# Patient Record
Sex: Female | Born: 1975 | Race: Black or African American | Hispanic: No | Marital: Married | Smoking: Never smoker
Health system: Southern US, Community
[De-identification: ages and names within clinical notes are randomized; demographics above are authoritative.]

## PROBLEM LIST (undated history)

## (undated) ENCOUNTER — Emergency Department (HOSPITAL_COMMUNITY): Discharge status: Against Medical Advice | Payer: Self-pay

## (undated) DIAGNOSIS — T7840XA Allergy, unspecified, initial encounter: Secondary | ICD-10-CM

## (undated) DIAGNOSIS — E041 Nontoxic single thyroid nodule: Secondary | ICD-10-CM

## (undated) DIAGNOSIS — K219 Gastro-esophageal reflux disease without esophagitis: Secondary | ICD-10-CM

## (undated) DIAGNOSIS — I719 Aortic aneurysm of unspecified site, without rupture: Secondary | ICD-10-CM

## (undated) DIAGNOSIS — R87619 Unspecified abnormal cytological findings in specimens from cervix uteri: Secondary | ICD-10-CM

## (undated) DIAGNOSIS — IMO0002 Reserved for concepts with insufficient information to code with codable children: Secondary | ICD-10-CM

## (undated) HISTORY — DX: Aortic aneurysm of unspecified site, without rupture: I71.9

## (undated) HISTORY — DX: Unspecified abnormal cytological findings in specimens from cervix uteri: R87.619

## (undated) HISTORY — DX: Allergy, unspecified, initial encounter: T78.40XA

## (undated) HISTORY — DX: Nontoxic single thyroid nodule: E04.1

## (undated) HISTORY — PX: BREAST SURGERY: SHX581

## (undated) HISTORY — PX: WISDOM TOOTH EXTRACTION: SHX21

## (undated) HISTORY — DX: Gastro-esophageal reflux disease without esophagitis: K21.9

## (undated) HISTORY — PX: INTRAUTERINE DEVICE INSERTION: SHX323

## (undated) HISTORY — PX: COLPOSCOPY: SHX161

## (undated) HISTORY — DX: Reserved for concepts with insufficient information to code with codable children: IMO0002

---

## 2004-09-28 HISTORY — PX: REDUCTION MAMMAPLASTY: SUR839

## 2004-09-28 HISTORY — PX: BREAST REDUCTION SURGERY: SHX8

## 2005-10-23 ENCOUNTER — Ambulatory Visit (HOSPITAL_COMMUNITY): Admission: RE | Admit: 2005-10-23 | Discharge: 2005-10-23 | Payer: Self-pay | Admitting: Cardiovascular Disease

## 2009-03-06 ENCOUNTER — Encounter: Payer: Self-pay | Admitting: Gastroenterology

## 2009-05-16 ENCOUNTER — Ambulatory Visit: Payer: Self-pay | Admitting: Gastroenterology

## 2009-05-16 DIAGNOSIS — K219 Gastro-esophageal reflux disease without esophagitis: Secondary | ICD-10-CM

## 2009-06-11 ENCOUNTER — Ambulatory Visit: Payer: Self-pay | Admitting: Gastroenterology

## 2009-06-12 ENCOUNTER — Telehealth: Payer: Self-pay | Admitting: Gastroenterology

## 2009-06-19 ENCOUNTER — Ambulatory Visit (HOSPITAL_COMMUNITY): Admission: RE | Admit: 2009-06-19 | Discharge: 2009-06-19 | Payer: Self-pay | Admitting: Gastroenterology

## 2009-06-20 ENCOUNTER — Encounter: Payer: Self-pay | Admitting: Gastroenterology

## 2009-06-25 ENCOUNTER — Ambulatory Visit: Payer: Self-pay | Admitting: Gastroenterology

## 2010-10-19 ENCOUNTER — Encounter: Payer: Self-pay | Admitting: Internal Medicine

## 2011-03-10 ENCOUNTER — Emergency Department (HOSPITAL_COMMUNITY)
Admission: EM | Admit: 2011-03-10 | Discharge: 2011-03-10 | Disposition: A | Discharge status: Home / Self Care | Payer: Self-pay | Attending: Emergency Medicine | Admitting: Emergency Medicine

## 2011-03-10 DIAGNOSIS — K089 Disorder of teeth and supporting structures, unspecified: Secondary | ICD-10-CM | POA: Insufficient documentation

## 2011-03-10 DIAGNOSIS — H9209 Otalgia, unspecified ear: Secondary | ICD-10-CM | POA: Insufficient documentation

## 2012-09-12 HISTORY — PX: INTRAUTERINE DEVICE INSERTION: SHX323

## 2012-10-13 ENCOUNTER — Other Ambulatory Visit (HOSPITAL_COMMUNITY)
Admission: RE | Admit: 2012-10-13 | Discharge: 2012-10-13 | Disposition: A | Discharge status: Home / Self Care | Payer: 59 | Source: Ambulatory Visit | Attending: Obstetrics & Gynecology | Admitting: Obstetrics & Gynecology

## 2012-10-13 ENCOUNTER — Encounter: Payer: Self-pay | Admitting: Obstetrics & Gynecology

## 2012-10-13 ENCOUNTER — Ambulatory Visit (INDEPENDENT_AMBULATORY_CARE_PROVIDER_SITE_OTHER): Payer: 59 | Admitting: Obstetrics & Gynecology

## 2012-10-13 VITALS — BP 129/89 | HR 64 | Temp 97.3°F | Ht 67.0 in | Wt 185.2 lb

## 2012-10-13 DIAGNOSIS — R87613 High grade squamous intraepithelial lesion on cytologic smear of cervix (HGSIL): Secondary | ICD-10-CM

## 2012-10-13 DIAGNOSIS — R87619 Unspecified abnormal cytological findings in specimens from cervix uteri: Secondary | ICD-10-CM | POA: Insufficient documentation

## 2012-10-13 NOTE — Progress Notes (Signed)
Patient ID: Christina Hardy, female   DOB: 1976-07-12, 36 y.o.   MRN: [id number]  Chief Complaint  Patient presents with  . Colposcopy    HPI Christina Hardy is a 37 y.o. female.   HPI  Indications: Pap smear on December 2013 showed: high-grade squamous intraepithelial neoplasia  (HGSIL-encompassing moderate and severe dysplasia); positive for high risk HPV. Previous colposcopy: no prior colposcopy. Prior cervical treatment: no treatment.  Past Medical History  Diagnosis Date  . Abnormal Pap smear     Past Surgical History  Procedure Date  . Breast reduction surgery 2006    Family History  Problem Relation Age of Onset  . Heart failure Father   . Cancer Mother     breast   . Hypertension Maternal Grandmother     Social History History  Substance Use Topics  . Smoking status: Never Smoker   . Smokeless tobacco: Never Used  . Alcohol Use: Yes     Comment: socially    Allergies  Allergen Reactions  . Latex Hives  . Prilosec (Omeprazole) Hives    Current Outpatient Prescriptions  Medication Sig Dispense Refill  . fluticasone (FLONASE) 50 MCG/ACT nasal spray Place 2 sprays into the nose as needed.      . vitamin E 1000 UNIT capsule Take 1,000 Units by mouth daily.        Review of Systems Review of Systems See HPI.  Blood pressure 129/89, pulse 64, temperature 97.3 F (36.3 C), temperature source Oral, height 5\' 7"  (1.702 m), weight 185 lb 3.2 oz (84.006 kg), last menstrual period 09/09/2012.  Physical Exam Physical Exam  Constitutional: She appears well-developed and well-nourished. No distress.  Genitourinary: Vagina normal and uterus normal. No vaginal discharge found.  Skin: She is not diaphoretic.   Data Reviewed Pap smear form 08/2012  Assessment    Procedure Details  Patient given informed consent, signed copy in the chart, time out was performed.  Placed in lithotomy position. Cervix viewed with speculum and colposcope after application of acetic  acid.   Colposcopy adequate?  yes Acetowhite lesions?yes at 3 and 9 o'clock Punctation?no Mosaicism?  no Abnormal vasculature?  no Biopsies?yes at 3 and 9 o'clock ECC?no  Patient was given post procedure instructions.  She will return in 2 weeks for results.  Carolyn L. Harraway-Smith, M.D., FACOG   Complications: none.     Plan    Extensive teaching on abnormal pap and HPV provided. Specimens labelled and sent to Pathology. Return to discuss Pathology results in 4 weeks. Reviewed LEEP video prior to leaving       BOOTH, ERIN 10/13/2012, 4:08 PM

## 2012-10-13 NOTE — Patient Instructions (Addendum)
Cervical Dysplasia Cervical dysplasia is a condition in which a woman has abnormal changes in the cells of her cervix. The cervix is the opening to the uterus (womb) between the vagina and the uterus. These changes are called cervical dysplasia and may be the first signs of cervical cancer. These cells can be taken from the cervix during a Pap test and then looked at under a microscope. With early detection, treatment, and close follow-up care, nearly all cervical dysplasia can be cured. If untreated, the mild to moderate stages of dysplasia often grow more severe.  RISK FACTORS  The following increase the risk for cervical dysplasia.  Having had a sexually transmitted disease, including:  Chlamydia.  Human papilloma virus (HPV).  Becoming sexually active before age 18.  Having had more than 1 sexual partner.  Not using protection, such as condoms, during sexual intercourse, especially with new sexual partners.  Having had cancer of the vagina or vulva.  Having a sexual partner whose previous partner had cancer of the cervix or cervical dysplasia.  Having a sexual partner who has or has had cancer of the penis.  Having a weakened immune system (HIV, organ transplant).  Being the daughter of a woman who took DES (diethylstilbestrol) during pregnancy.  A history of cervical cancer in a woman's sister or mother.  Smoking.  Having had an abnormal Pap test in the past. SYMPTOMS  There are usually no symptoms. If there are symptoms, they may be vague such as:  Abnormal vaginal discharge.  Bleeding between periods or following intercourse.  Bleeding during menopause.  Pain on intercourse (dyspareunia). DIAGNOSIS   The Pap test is the best way of detecting abnormalities of the cervix.  Biopsy (removing a piece of tissue to look at under the microscope) of the cervix when the Pap test is abnormal or when the Pap test is normal, but the cervix looks abnormal. TREATMENT   Catching and treating the changes early with Pap tests can prevent cervical cancer.  Cryotherapy freezes the abnormal cells with a steel tip instrument.  A laser can be used to remove the abnormal cells.  Loop electrocautery excision procedure (LEEP). This procedure uses a heated electrical loop to remove a cone-like portion of the cervix, including the cervical canal.  For more serious cases of cervical dysplasia, the abnormal tissue may be removed surgically by:  A cone biopsy (by cold knife, laser or LEEP). A procedure in which a portion of the center of the cervix with the cervical canal is removed.  The uterus and cervix are removed (hysterectomy). Your caregiver will advise you regarding the need and timing of Pap tests in your follow-up. Women who have been treated for dysplasia should be closely followed with pelvic exams and Pap tests. During the first year following treatment of cervical dysplasia, Pap tests should be done every 3 to 4 months. In the second year, the schedule is every 6 months, or as recommended by your caregiver. See your caregiver for new or worsening problems. HOME CARE INSTRUCTIONS   Follow the instructions and recommendations of your caregiver regarding medicines and follow-up appointments.  Only take over-the-counter or prescription medicines for pain or discomfort as directed by your caregiver.  Cramping and pelvic discomfort may follow cryotherapy. It is not abnormal to have watery discharge for several weeks after.  Laser, cone surgery, cryotherapy or LEEP can cause a bad smelling vaginal discharge. It may also cause vaginal bleeding for a couple weeks following the procedure. The discharge   may be black from the paste used to control bleeding from the cone site. This is normal.  Do not use tampons, have sexual intercourse or douche until your caregiver says it is okay. SEEK MEDICAL CARE IF:   You develop genital warts.  You need a prescription for  pain medicine following your treatment. SEEK IMMEDIATE MEDICAL CARE IF:   Your bleeding is heavier than a normal menstrual period.  You develop bright red bleeding, especially if you have blood clots.  You have a fever.  You have increasing cramps or pain not relieved with medicine.  You are lightheaded, unusually weak, or have fainting spells.  You have abnormal vaginal discharge.  You develop abdominal pain. PREVENTION   The surest way to prevent cervical dysplasia is to abstain from sexual intercourse.  Practice safe sex, use condoms and have only one sex partner who does not have other sex partners.  A Pap test is done to screen for cervical cancer.  The first Pap test should be done at age 21.  Between ages 21 and 29, Pap tests are repeated every 2 years.  Beginning at age 30, you are advised to have a Pap test every 3 years as long as your past 3 Pap tests have been normal.  Some women have medical problems that increase the chance of getting cervical cancer. Talk to your caregiver about these problems. It is especially important to talk to your caregiver if a new problem develops soon after your last Pap test. In these cases, your caregiver may recommend more frequent screening and Pap tests.  The above recommendations are the same for women who have or have not gotten the vaccine for HPV (Human Papillomavirus).  If you had a hysterectomy for a problem that was not a cancer or a condition that could lead to cancer, then you no longer need Pap tests. However, even if you no longer need a Pap test, a regular exam is a good idea to make sure no other problems are starting.   If you are between ages 65 and 70, and you have had normal Pap tests going back 10 years, you no longer need Pap tests. However, even if you no longer need a Pap test, a regular exam is a good idea to make sure no other problems are starting.   If you have had past treatment for cervical cancer or a  condition that could lead to cancer, you need Pap tests and screening for cancer for at least 20 years after your treatment.  If Pap tests have been discontinued, risk factors (such as a new sexual partner) need to be re-assessed to determine if screening should be resumed.  Some women may need screenings more often if they are at high risk for cervical cancer.  Your caregiver may do additional tests including:  Colposcopy. A procedure in which a special microscope magnifies the cells and allows the provider to closely examine the cervix, vagina, and vulva.  Biopsy. A small tissue sample is taken from the cervix, vagina or vulva. This is generally done in your caregivers office.  A cone biopsy (cold knife or laser). A large tissue sample is taken from the cervix. This procedure is usually done in an operating room under a general anesthetic. The cone often removes all abnormal tissue and so may also complete the treatment.  LEEP, also removing a circular portion of the cervix and is done in a doctors office under a local anesthetic.  Now there   is a vaccine, Gardasil, that was developed to prevent the HPV'S that can cause cancer of the cervix and genital warts. It is recommended for females ages 9 to 26. It should not be given to pregnant women until more is known about its effects on the fetus. Not all cancers of the cervix are caused by the HPV. Routine gynecology exams and Pap tests should continue as recommended by your caregiver. Document Released: 09/14/2005 Document Revised: 12/07/2011 Document Reviewed: 09/05/2008 Exit[employer] Patient Information 2013 [employer]. Loop Electrosurgical Excision Procedure Loop electrosurgical excision procedure (LEEP) is the removal of a portion of the lower part of the uterus (cervix). The procedure is done when there are significantly abnormal cervical cell changes. Abnormal cell changes of the cervix can lead to cancer if left in place and untreated.   The LEEP procedure itself typically only takes a few minutes. Often, it may be done in your caregiver's office. The procedure is considered safe for those who wish to get pregnant or are trying to get pregnant. Only under rare circumstances should this procedure be done if you are pregnant. LET YOUR CAREGIVER KNOW ABOUT:  Whether you are pregnant or late for your last menstrual period.  Allergies to foods or medicines.  All the medicines you are taking includingherbs, eyedrops, and over-the-counter medicines, and creams.  Use of steroids (by mouth or creams).  Previous problems with anesthetics or numbing medicine.  Previous gynecological surgery.  History of blood clots or bleeding problems.  Any recent or current vaginal infections (herpes, sexually transmitted infections).  Other health problems. RISKS AND COMPLICATIONS  Bleeding.  Infection.  Injury to the vagina, bladder, or rectum.  Very rare obstruction of the cervical opening that causes problems during menstruation (cervical stenosis). BEFORE THE PROCEDURE  Do not take aspirin or blood thinners (anticoagulants) for 1 week before the procedure, or as told by your caregiver.  Eat a light meal before the procedure.  Ask your caregiver about changing or stopping your regular medicines.  You may be given a pain reliever 1 or 2 hours before the procedure. PROCEDURE   A tool (speculum) is placed in the vagina. This allows your caregiver to see the cervix.  An iodine stain is applied to the cervix to find the area of abnormal cells to be removed.  Medicine is injected to numb the cervix (local anesthetic).   Electricity is passed through a thin wire loop which is then used to remove (cauterize) a small segment of the affected cervix.  Light electrocautery is used to seal any small blood vessels and prevent bleeding.  A paste may be applied to the cauterized area of the cervix to help prevent bleeding.  The  tissue sample is sent to the lab. It is examined under the microscope. AFTER THE PROCEDURE  Have someone drive you home.  You may have slight to moderate cramping.  You may notice a black vaginal discharge from the paste used on the cervix to prevent bleeding. This is normal.  Watch for excessive bleeding. This requires immediate medical care.  Ask when your test results will be ready. Make sure you get your test results. Document Released: 12/05/2002 Document Revised: 12/07/2011 Document Reviewed: 02/24/2011 Exit[employer] Patient Information 2013 [employer].  

## 2012-10-17 ENCOUNTER — Telehealth: Payer: Self-pay | Admitting: General Practice

## 2012-10-17 NOTE — Telephone Encounter (Signed)
Called patient, no answer, left message to call us back for additional information

## 2012-10-17 NOTE — Telephone Encounter (Signed)
Message copied by Kathee Delton on Mon Oct 17, 2012  3:39 PM ------      Message from: Christina Hardy      Created: Mon Oct 17, 2012  1:41 PM       Please call pt.  Needs f/u PAP and HPV in 1 year.  For low grade dysplasia            Thx,      clh-S

## 2012-10-18 NOTE — Telephone Encounter (Signed)
Called pt and informed her of Pap result and recommendation for follow up per Dr. Erin Fulling. Pt agreed and voiced understanding.

## 2012-11-10 ENCOUNTER — Ambulatory Visit: Payer: 59 | Admitting: Obstetrics & Gynecology

## 2012-11-18 ENCOUNTER — Telehealth: Payer: Self-pay | Admitting: Obstetrics & Gynecology

## 2012-11-18 DIAGNOSIS — K219 Gastro-esophageal reflux disease without esophagitis: Secondary | ICD-10-CM

## 2012-11-18 DIAGNOSIS — IMO0002 Reserved for concepts with insufficient information to code with codable children: Secondary | ICD-10-CM | POA: Insufficient documentation

## 2012-11-18 NOTE — Telephone Encounter (Signed)
Faculty Practice OB/GYN Attending Phone Call Documentation  Received call from Dr. Faythe Casa from [facility] regarding follow up plans for a patient that was referred to [place] for colposcopy in the setting of HGSIL pap.  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  Patient was initially given the option of repeating cotesting in 12 months.  Dr. Faythe Casa felt that patient should be offered a diagnostic excisional procedure (LEEP) given concern about high grade dysplasia.  Patient was called and her primary GYN's recommendation was discussed with her.  Patient will contact her insurance company to see if this procedure will be covered; she was informed of the estimated cost of the LEEP at our clinic.  She was advised to see if the cost will be better at her primary GYN's office.  Patient will call back to make appointment for LEEP if she decides to undergo this procedure at our clinic.  Of note, patient has already watched the LEEP video and will only need to be scheduled for the procedure.  Jaynie Collins, MD, FACOG Attending Obstetrician & Gynecologist Faculty Practice, [facility] [place]

## 2012-12-01 ENCOUNTER — Ambulatory Visit (INDEPENDENT_AMBULATORY_CARE_PROVIDER_SITE_OTHER): Payer: 59 | Admitting: Obstetrics & Gynecology

## 2012-12-01 ENCOUNTER — Encounter: Payer: Self-pay | Admitting: Obstetrics & Gynecology

## 2012-12-01 VITALS — BP 129/84 | HR 55 | Temp 97.1°F | Ht 67.0 in | Wt 184.8 lb

## 2012-12-01 DIAGNOSIS — N870 Mild cervical dysplasia: Secondary | ICD-10-CM

## 2012-12-01 NOTE — Progress Notes (Signed)
Patient ID: Christina Hardy, female   DOB: 1976-05-17, 37 y.o.   MRN: [id number]  HPI Christina Hardy is a 37 y.o. female. Pt presents for review of results of colpo and PAP. The results did not agree and patient was told by another outside Christina Hardy that she needed a LEEP.  Pt with no prior pregnancies hope to have children someday.  THe patient was not aware of the potential long term complications of a LEEP.    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 Prior cervical treatment: no treatment.  Past Medical History  Diagnosis Date  . Abnormal Pap smear     Past Surgical History  Procedure Laterality Date  . Breast reduction surgery  2006    Family History  Problem Relation Age of Onset  . Heart failure Father   . Cancer Mother     breast   . Hypertension Maternal Grandmother     Social History History  Substance Use Topics  . Smoking status: Never Smoker   . Smokeless tobacco: Never Used  . Alcohol Use: Yes     Comment: socially    Allergies  Allergen Reactions  . Latex Hives  . Prilosec (Omeprazole) Hives    Current Outpatient Prescriptions  Medication Sig Dispense Refill  . fluticasone (FLONASE) 50 MCG/ACT nasal spray Place 2 sprays into the nose as needed.      . vitamin E 1000 UNIT capsule Take 1,000 Units by mouth daily.       No current facility-administered medications for this visit.    Review of Systems Review of Systems  Blood pressure 129/84, pulse 55, temperature 97.1 F (36.2 C), temperature source Oral, height 5\' 7"  (1.702 m), weight 184 lb 12.8 oz (83.825 kg), last menstrual period 11/29/2012.  Physical Exam Physical Exam  Data Reviewed Results above  Assessment    Procedure Details  The risks and benefits of the procedure and Written informed consent obtained. After discussion of LEEP potential complication pt opted for repeat colpo with bx.  lPatient given informed consent, signed copy in the chart, time out was performed.  Placed in lithotomy position. Cervix viewed with speculum and colposcope after application of acetic acid.   Colposcopy adequate?  Yes Acetowhite lesions?none Punctation?none Mosaicism?  none Abnormal vasculature? none Biopsies?2 at 5:00 and 7:00 these were areas of sl decreased uptake of Lugol's ECC?yes  This was the patients 2nd colpo after her HGSIL/ ?CIS pap.  I repeated the PAP today again as the colpo was certainly more in line with the low grade dysplasia result from the prev colpo  Patient was given post procedure instructions.  She will return in 2-4 weeks for results.   HARRAWAY-SMITH, CAROLYN 12/01/2012, 4:42 PM

## 2012-12-01 NOTE — Patient Instructions (Signed)
Cervical Dysplasia Cervical dysplasia is a condition in which a woman has abnormal changes in the cells of her cervix. The cervix is the opening to the uterus (womb) between the vagina and the uterus. These changes are called cervical dysplasia and may be the first signs of cervical cancer. These cells can be taken from the cervix during a Pap test and then looked at under a microscope. With early detection, treatment, and close follow-up care, nearly all cervical dysplasia can be cured. If untreated, the mild to moderate stages of dysplasia often grow more severe.  RISK FACTORS  The following increase the risk for cervical dysplasia.  Having had a sexually transmitted disease, including:  Chlamydia.  Human papilloma virus (HPV).  Becoming sexually active before age 18.  Having had more than 1 sexual partner.  Not using protection, such as condoms, during sexual intercourse, especially with new sexual partners.  Having had cancer of the vagina or vulva.  Having a sexual partner whose previous partner had cancer of the cervix or cervical dysplasia.  Having a sexual partner who has or has had cancer of the penis.  Having a weakened immune system (HIV, organ transplant).  Being the daughter of a woman who took DES (diethylstilbestrol) during pregnancy.  A history of cervical cancer in a woman's sister or mother.  Smoking.  Having had an abnormal Pap test in the past. SYMPTOMS  There are usually no symptoms. If there are symptoms, they may be vague such as:  Abnormal vaginal discharge.  Bleeding between periods or following intercourse.  Bleeding during menopause.  Pain on intercourse (dyspareunia). DIAGNOSIS   The Pap test is the best way of detecting abnormalities of the cervix.  Biopsy (removing a piece of tissue to look at under the microscope) of the cervix when the Pap test is abnormal or when the Pap test is normal, but the cervix looks abnormal. TREATMENT    Catching and treating the changes early with Pap tests can prevent cervical cancer.  Cryotherapy freezes the abnormal cells with a steel tip instrument.  A laser can be used to remove the abnormal cells.  Loop electrocautery excision procedure (LEEP). This procedure uses a heated electrical loop to remove a cone-like portion of the cervix, including the cervical canal.  For more serious cases of cervical dysplasia, the abnormal tissue may be removed surgically by:  A cone biopsy (by cold knife, laser or LEEP). A procedure in which a portion of the center of the cervix with the cervical canal is removed.  The uterus and cervix are removed (hysterectomy). Your caregiver will advise you regarding the need and timing of Pap tests in your follow-up. Women who have been treated for dysplasia should be closely followed with pelvic exams and Pap tests. During the first year following treatment of cervical dysplasia, Pap tests should be done every 3 to 4 months. In the second year, the schedule is every 6 months, or as recommended by your caregiver. See your caregiver for new or worsening problems. HOME CARE INSTRUCTIONS   Follow the instructions and recommendations of your caregiver regarding medicines and follow-up appointments.  Only take over-the-counter or prescription medicines for pain or discomfort as directed by your caregiver.  Cramping and pelvic discomfort may follow cryotherapy. It is not abnormal to have watery discharge for several weeks after.  Laser, cone surgery, cryotherapy or LEEP can cause a bad smelling vaginal discharge. It may also cause vaginal bleeding for a couple weeks following the procedure. The   discharge may be black from the paste used to control bleeding from the cone site. This is normal.  Do not use tampons, have sexual intercourse or douche until your caregiver says it is okay. SEEK MEDICAL CARE IF:   You develop genital warts.  You need a prescription for  pain medicine following your treatment. SEEK IMMEDIATE MEDICAL CARE IF:   Your bleeding is heavier than a normal menstrual period.  You develop bright red bleeding, especially if you have blood clots.  You have a fever.  You have increasing cramps or pain not relieved with medicine.  You are lightheaded, unusually weak, or have fainting spells.  You have abnormal vaginal discharge.  You develop abdominal pain. PREVENTION   The surest way to prevent cervical dysplasia is to abstain from sexual intercourse.  Practice safe sex, use condoms and have only one sex partner who does not have other sex partners.  A Pap test is done to screen for cervical cancer.  The first Pap test should be done at age 21.  Between ages 21 and 29, Pap tests are repeated every 2 years.  Beginning at age 30, you are advised to have a Pap test every 3 years as long as your past 3 Pap tests have been normal.  Some women have medical problems that increase the chance of getting cervical cancer. Talk to your caregiver about these problems. It is especially important to talk to your caregiver if a new problem develops soon after your last Pap test. In these cases, your caregiver may recommend more frequent screening and Pap tests.  The above recommendations are the same for women who have or have not gotten the vaccine for HPV (Human Papillomavirus).  If you had a hysterectomy for a problem that was not a cancer or a condition that could lead to cancer, then you no longer need Pap tests. However, even if you no longer need a Pap test, a regular exam is a good idea to make sure no other problems are starting.   If you are between ages 65 and 70, and you have had normal Pap tests going back 10 years, you no longer need Pap tests. However, even if you no longer need a Pap test, a regular exam is a good idea to make sure no other problems are starting.   If you have had past treatment for cervical cancer or a  condition that could lead to cancer, you need Pap tests and screening for cancer for at least 20 years after your treatment.  If Pap tests have been discontinued, risk factors (such as a new sexual partner) need to be re-assessed to determine if screening should be resumed.  Some women may need screenings more often if they are at high risk for cervical cancer.  Your caregiver may do additional tests including:  Colposcopy. A procedure in which a special microscope magnifies the cells and allows the provider to closely examine the cervix, vagina, and vulva.  Biopsy. A small tissue sample is taken from the cervix, vagina or vulva. This is generally done in your caregivers office.  A cone biopsy (cold knife or laser). A large tissue sample is taken from the cervix. This procedure is usually done in an operating room under a general anesthetic. The cone often removes all abnormal tissue and so may also complete the treatment.  LEEP, also removing a circular portion of the cervix and is done in a doctors office under a local anesthetic.  Now   there is a vaccine, Gardasil, that was developed to prevent the HPV'S that can cause cancer of the cervix and genital warts. It is recommended for females ages 9 to 26. It should not be given to pregnant women until more is known about its effects on the fetus. Not all cancers of the cervix are caused by the HPV. Routine gynecology exams and Pap tests should continue as recommended by your caregiver. Document Released: 09/14/2005 Document Revised: 12/07/2011 Document Reviewed: 09/05/2008 [employer] Patient Information 2013 [employer].  

## 2012-12-02 ENCOUNTER — Other Ambulatory Visit: Payer: Self-pay | Admitting: Obstetrics & Gynecology

## 2012-12-05 ENCOUNTER — Other Ambulatory Visit (HOSPITAL_COMMUNITY)
Admission: RE | Admit: 2012-12-05 | Discharge: 2012-12-05 | Disposition: A | Discharge status: Home / Self Care | Payer: 59 | Source: Ambulatory Visit | Attending: Obstetrics & Gynecology | Admitting: Obstetrics & Gynecology

## 2012-12-05 DIAGNOSIS — N870 Mild cervical dysplasia: Secondary | ICD-10-CM | POA: Insufficient documentation

## 2012-12-05 NOTE — Addendum Note (Signed)
Addended by: Faythe Casa on: 12/05/2012 08:32 AM   Modules accepted: Orders

## 2012-12-11 ENCOUNTER — Encounter: Payer: Self-pay | Admitting: Obstetrics & Gynecology

## 2012-12-12 ENCOUNTER — Telehealth: Payer: Self-pay | Admitting: General Practice

## 2012-12-12 NOTE — Telephone Encounter (Signed)
Called patient and informed patient of results & recommendations. Patient verbalized understanding and asked if we could cancel her results appt in April then. Told patient I would do that for her. Patient had no further questions. Patient will call back in August for appt.

## 2012-12-12 NOTE — Telephone Encounter (Signed)
Message copied by Kathee Delton on Mon Dec 12, 2012 11:44 AM ------      Message from: Willodean Rosenthal      Created: Sun Dec 11, 2012  5:19 PM       Please call pt.  Rec f/u PAP in 6months            clh-[id number]    ------

## 2012-12-30 ENCOUNTER — Ambulatory Visit: Payer: 59 | Admitting: Obstetrics & Gynecology

## 2013-08-03 ENCOUNTER — Other Ambulatory Visit: Payer: Self-pay

## 2013-09-27 ENCOUNTER — Encounter: Payer: Self-pay | Admitting: Certified Nurse Midwife

## 2013-10-02 ENCOUNTER — Ambulatory Visit: Payer: Self-pay | Admitting: Certified Nurse Midwife

## 2013-10-02 ENCOUNTER — Encounter: Payer: Self-pay | Admitting: Certified Nurse Midwife

## 2013-10-03 ENCOUNTER — Telehealth: Payer: Self-pay

## 2013-10-03 NOTE — Telephone Encounter (Signed)
lmtcb

## 2013-10-03 NOTE — Telephone Encounter (Signed)
Message copied by Susy Manor on Tue Oct 03, 2013  1:10 PM ------      Message from: Regina Eck      Created: Tue Oct 03, 2013 12:05 PM       Patient needs call due to history of abnormal pap with follow up at [facility]. Has she had aex with clinic and pap follow up? ------

## 2013-10-16 NOTE — Telephone Encounter (Signed)
Left message for call back.

## 2013-10-20 NOTE — Telephone Encounter (Signed)
Left message for call back.

## 2013-10-24 NOTE — Telephone Encounter (Signed)
I called patient 3 times with no callback. Please advise

## 2013-10-24 NOTE — Telephone Encounter (Signed)
Sent to DR. Sabra Heck for review

## 2013-12-01 NOTE — Telephone Encounter (Signed)
[name] this patient was seen at [facility] for [name] and they are following her from the notes I just read. We do not need to contact anymore. See pathology and phone conversation noted.

## 2013-12-01 NOTE — Telephone Encounter (Signed)
Encounter closed

## 2013-12-01 NOTE — Telephone Encounter (Signed)
Routed to DL, I do not see a letter in epic

## 2014-01-09 ENCOUNTER — Ambulatory Visit (INDEPENDENT_AMBULATORY_CARE_PROVIDER_SITE_OTHER): Payer: 59 | Admitting: Family Medicine

## 2014-01-09 VITALS — BP 108/82 | HR 57 | Temp 97.9°F | Resp 18 | Ht 68.0 in | Wt 181.0 lb

## 2014-01-09 DIAGNOSIS — R059 Cough, unspecified: Secondary | ICD-10-CM

## 2014-01-09 DIAGNOSIS — J069 Acute upper respiratory infection, unspecified: Secondary | ICD-10-CM

## 2014-01-09 DIAGNOSIS — R05 Cough: Secondary | ICD-10-CM

## 2014-01-09 DIAGNOSIS — J309 Allergic rhinitis, unspecified: Secondary | ICD-10-CM

## 2014-01-09 MED ORDER — HYDROCOD POLST-CHLORPHEN POLST 10-8 MG/5ML PO LQCR: 5.0000 mL | Freq: Every evening | ORAL | Status: DC | PRN

## 2014-01-09 MED ORDER — MOMETASONE FUROATE 50 MCG/ACT NA SUSP: 2.0000 | Freq: Every day | NASAL | Status: DC

## 2014-01-09 MED ORDER — AMOXICILLIN 875 MG PO TABS: 875.0000 mg | ORAL_TABLET | Freq: Two times a day (BID) | ORAL | Status: DC

## 2014-01-09 NOTE — Patient Instructions (Signed)
Sudafed (generic fine) twice a day for congestion Mucinex (plain, generic fine) Can fill and start antibiotic if no improvement in 2-4 days.  Allergic Rhinitis Allergic rhinitis is when the mucous membranes in the nose respond to allergens. Allergens are particles in the air that cause your body to have an allergic reaction. This causes you to release allergic antibodies. Through a chain of events, these eventually cause you to release histamine into the blood stream. Although meant to protect the body, it is this release of histamine that causes your discomfort, such as frequent sneezing, congestion, and an itchy, runny nose.  CAUSES  Seasonal allergic rhinitis (hay fever) is caused by pollen allergens that may come from grasses, trees, and weeds. Year-round allergic rhinitis (perennial allergic rhinitis) is caused by allergens such as house dust mites, pet dander, and mold spores.  SYMPTOMS   Nasal stuffiness (congestion).  Itchy, runny nose with sneezing and tearing of the eyes. DIAGNOSIS  Your health care provider can help you determine the allergen or allergens that trigger your symptoms. If you and your health care provider are unable to determine the allergen, skin or blood testing may be used. TREATMENT  Allergic Rhinitis does not have a cure, but it can be controlled by:  Medicines and allergy shots (immunotherapy).  Avoiding the allergen. Hay fever may often be treated with antihistamines in pill or nasal spray forms. Antihistamines block the effects of histamine. There are over-the-counter medicines that may help with nasal congestion and swelling around the eyes. Check with your health care provider before taking or giving this medicine.  If avoiding the allergen or the medicine prescribed do not work, there are many new medicines your health care provider can prescribe. Stronger medicine may be used if initial measures are ineffective. Desensitizing injections can be used if  medicine and avoidance does not work. Desensitization is when a patient is given ongoing shots until the body becomes less sensitive to the allergen. Make sure you follow up with your health care provider if problems continue. HOME CARE INSTRUCTIONS It is not possible to completely avoid allergens, but you can reduce your symptoms by taking steps to limit your exposure to them. It helps to know exactly what you are allergic to so that you can avoid your specific triggers. SEEK MEDICAL CARE IF:   You have a fever.  You develop a cough that does not stop easily (persistent).  You have shortness of breath.  You start wheezing.  Symptoms interfere with normal daily activities. Document Released: 06/09/2001 Document Revised: 07/05/2013 Document Reviewed: 05/22/2013 [facility] Patient Information 2014 [employer].

## 2014-01-09 NOTE — Progress Notes (Signed)
   Subjective:    Patient ID: Christina Hardy, female    DOB: Nov 18, 1975, 38 y.o.   MRN: [id number]  HPI Patient has had scratchy throat for 7 days. Felt like it started in her head and has moved to her chest. She is unable to cough anything up. Feels achy and fatigued. Headaches temporal area and around ears. Has year round allergies. Previously took Flonase with some relief, doesn't like using nasal spray. Is taking Tylenol cold and sinus- some relief. Has been doing steam treatments with some decreased congestion.   Has regular care from her gyn.  Review of Systems Not sure of ear pain- has had some fullness, no more sore throat, no fever, no shortness of breath. Night time cough.    Objective:   Physical Exam  Vitals reviewed. Constitutional: She is oriented to person, place, and time. She appears well-developed and well-nourished.  HENT:  Head: Normocephalic and atraumatic.  Right Ear: External ear and ear canal normal.  Left Ear: Tympanic membrane, external ear and ear canal normal.  Nose: Mucosal edema and rhinorrhea present. Right sinus exhibits no maxillary sinus tenderness and no frontal sinus tenderness. Left sinus exhibits no maxillary sinus tenderness and no frontal sinus tenderness.  Mouth/Throat: Uvula is midline, oropharynx is clear and moist and mucous membranes are normal.  Right TM opaque.  Eyes: Conjunctivae are normal. Right eye exhibits no discharge. Left eye exhibits no discharge. No scleral icterus.  Neck: Normal range of motion. Neck supple.  Cardiovascular: Normal rate, regular rhythm and normal heart sounds.   Pulmonary/Chest: Effort normal and breath sounds normal. No respiratory distress. She has no wheezes. She has no rales. She exhibits no tenderness.  Musculoskeletal: Normal range of motion.  Lymphadenopathy:    She has no cervical adenopathy.  Neurological: She is alert and oriented to person, place, and time.  Skin: Skin is warm and dry.         Assessment & Plan:  1. Acute upper respiratory infections of unspecified site -Suspect viral infection, but provided wait and see prescription for antibiotic if no improvement in 2-4 days. - amoxicillin (AMOXIL) 875 MG tablet; Take 1 tablet (875 mg total) by mouth 2 (two) times daily.  Dispense: 20 tablet; Refill: 0  2. Allergic rhinitis - Encouraged patient to use nasal steroid. She wishes to restart flonase. I provided a prescription for Nasonex if she decides she can't tolerate flonase.  3. Cough - chlorpheniramine-HYDROcodone (TUSSIONEX PENNKINETIC ER) 10-8 MG/5ML LQCR; Take 5 mLs by mouth at bedtime as needed for cough (cough).  Dispense: 70 mL; Refill: 0  Patient instructions include- Sudafed (generic fine) twice a day for congestion Mucinex (plain, generic fine) Can fill and start antibiotic if no improvement in 2-4 days.   Elby Beck, FNP-BC  [facility]  01/09/2014 1:24 PM

## 2014-01-15 NOTE — Progress Notes (Signed)
I have discussed this case with Ms. Gessner, NP and agree.  

## 2014-04-19 ENCOUNTER — Telehealth: Payer: Self-pay | Admitting: *Deleted

## 2014-04-19 NOTE — Telephone Encounter (Signed)
Message left to return call.  RE:  Scheduling recall AEX

## 2014-06-21 NOTE — Telephone Encounter (Signed)
Per 10/03/13 phone note, pt is receiving care elsewhere and no further follow up is needed.  Closing encounter.

## 2016-03-12 ENCOUNTER — Ambulatory Visit (INDEPENDENT_AMBULATORY_CARE_PROVIDER_SITE_OTHER): Payer: 59 | Admitting: Certified Nurse Midwife

## 2016-03-12 ENCOUNTER — Encounter: Payer: Self-pay | Admitting: Certified Nurse Midwife

## 2016-03-12 VITALS — BP 98/62 | HR 64 | Resp 16 | Ht 67.0 in | Wt 189.0 lb

## 2016-03-12 DIAGNOSIS — Z0000 Encounter for general adult medical examination without abnormal findings: Secondary | ICD-10-CM | POA: Diagnosis not present

## 2016-03-12 DIAGNOSIS — Z01419 Encounter for gynecological examination (general) (routine) without abnormal findings: Secondary | ICD-10-CM | POA: Diagnosis not present

## 2016-03-12 DIAGNOSIS — Z124 Encounter for screening for malignant neoplasm of cervix: Secondary | ICD-10-CM | POA: Diagnosis not present

## 2016-03-12 LAB — POCT URINALYSIS DIPSTICK
Bilirubin, UA: NEGATIVE
Glucose, UA: NEGATIVE
Ketones, UA: NEGATIVE
Leukocytes, UA: NEGATIVE
Nitrite, UA: NEGATIVE
PROTEIN UA: NEGATIVE
RBC UA: NEGATIVE
UROBILINOGEN UA: NEGATIVE
pH, UA: 5

## 2016-03-12 LAB — TSH: TSH: 1.21 m[IU]/L

## 2016-03-12 NOTE — Progress Notes (Signed)
40 y.o. [id number] Single  African American Fe here to re-establish gyn care and  for annual exam. Periods scant to none with Mirena IUD. Has not had pap follow since 2014 with last pap LSIL with colpo with [facility]. Sexually active no partner change, but desires all STD screening. Sees Urgent care if needed. No health issues today." Happy to be back at [facility]"!   No LMP recorded. Patient is not currently having periods (Reason: IUD).          Sexually active: Yes.    The current method of family planning is IUD.    Exercising: Yes.    jog Smoker:  no  Health Maintenance: Pap: 3/14  ASCUS HPV HR +, 1/14 colpo done & another one done again 3/14 LSIL continued with  Biopsy, but HGSIL. CIN1, pt declined the LEEP MMG: yrs ago Colonoscopy:  none BMD:   none TDaP:  2013 Shingles: no Pneumonia: no Hep C and HIV: maybe had done Labs: poct urine-neg, hgb-13.6 Self breast exam: done occ   reports that she has never smoked. She has never used smokeless tobacco. She reports that she does not drink alcohol or use illicit drugs.  Past Medical History  Diagnosis Date  . Abnormal Pap smear     yrs ago  . Allergy     Past Surgical History  Procedure Laterality Date  . Breast reduction surgery  2006  . Intrauterine device insertion  09-12-12    mirena insertion  . Breast surgery    . Colposcopy      Current Outpatient Prescriptions  Medication Sig Dispense Refill  . fluticasone (FLONASE) 50 MCG/ACT nasal spray Place 2 sprays into the nose as needed.    . Multiple Vitamins-Minerals (HAIR SKIN NAILS PO) Take by mouth daily.    . Multiple Vitamins-Minerals (MULTIVITAMIN PO) Take by mouth daily.     No current facility-administered medications for this visit.    Family History  Problem Relation Age of Onset  . Heart failure Father   . Heart disease Father   . Breast cancer Mother   . Hypertension Maternal Grandmother   . Heart disease Paternal Grandmother     ROS:  Pertinent  items are noted in HPI.  Otherwise, a comprehensive ROS was negative.  Exam:   BP 98/62 mmHg  Pulse 64  Resp 16  Ht 5\' 7"  (1.702 m)  Wt 189 lb (85.73 kg)  BMI 29.59 kg/m2 Height: 5\' 7"  (170.2 cm) Ht Readings from Last 3 Encounters:  03/12/16 5\' 7"  (1.702 m)  01/09/14 5\' 8"  (1.727 m)  12/01/12 5\' 7"  (1.702 m)    General appearance: alert, cooperative and appears stated age Head: Normocephalic, without obvious abnormality, atraumatic Neck: no adenopathy, supple, symmetrical, trachea midline and thyroid normal to inspection and palpation Lungs: clear to auscultation bilaterally Breasts: normal appearance, no masses or tenderness, No nipple retraction or dimpling, No nipple discharge or bleeding, No axillary or supraclavicular adenopathy Heart: regular rate and rhythm Abdomen: soft, non-tender; no masses,  no organomegaly Extremities: extremities normal, atraumatic, no cyanosis or edema Skin: Skin color, texture, turgor normal. No rashes or lesions Lymph nodes: Cervical, supraclavicular, and axillary nodes normal. No abnormal inguinal nodes palpated Neurologic: Grossly normal   Pelvic: External genitalia:  no lesions              Urethra:  normal appearing urethra with no masses, tenderness or lesions  Bartholin's and Skene's: normal                 Vagina: normal appearing vagina with normal color and discharge, no lesions              Cervix: no cervical motion tenderness, nulliparous appearance and normal appearance  IUD string noted in cervical os.              Pap taken: Yes.   Bimanual Exam:  Uterus:  normal size, contour, position, consistency, mobility, non-tender              Adnexa: normal adnexa and no mass, fullness, tenderness               Rectovaginal: Confirms               Anus:  normal sphincter tone, no lesions  Chaperone present: yes  A:  Well Woman with normal exam  Contraception MIrena IUD due for removal 08/28/17.  History of HSIL with LEEP  recommended, declined at [facility] repeat pap's every 6 months with LSIL only, last pap 2014 with same Follow up pap today  Screening labs  P:   Reviewed health and wellness pertinent to exam  Reviewed warning signs with IUD and need to advise  Stressed importance of Pap follow up  Labs:Hep.C, HIV,HSV 1,2, Lipid panel, GC,CHlamydia, RPR,TSH, Vitamin D, Affirm  Pap smear as above with HPVHR   counseled on breast self exam, mammography screening, given information to screen mammogram, STD prevention, HIV risk factors and prevention, adequate intake of calcium and vitamin D, diet and exercise  return annually or prn papsmear  An After Visit Summary was printed and given to the patient.

## 2016-03-12 NOTE — Progress Notes (Signed)
Encounter reviewed by Dr. Brook Amundson C. Silva.  

## 2016-03-12 NOTE — Patient Instructions (Signed)

## 2016-03-13 LAB — LIPID PANEL
Cholesterol: 149 mg/dL (ref 125–200)
HDL: 60 mg/dL (ref >=46)
LDL Cholesterol: 76 mg/dL (ref <130)
Total CHOL/HDL Ratio: 2.5 Ratio (ref <=5.0)
Triglycerides: 63 mg/dL (ref <150)
VLDL: 13 mg/dL (ref <30)

## 2016-03-13 LAB — HSV(HERPES SIMPLEX VRS) I + II AB-IGG: HSV 2 Glycoprotein G Ab, IgG: <0.9 Index (ref <0.90)

## 2016-03-13 LAB — HEPATITIS C ANTIBODY: HCV AB: NEGATIVE

## 2016-03-13 LAB — WET PREP BY MOLECULAR PROBE
Candida species: NEGATIVE
Gardnerella vaginalis: NEGATIVE
TRICHOMONAS VAG: NEGATIVE

## 2016-03-13 LAB — RPR

## 2016-03-13 LAB — VITAMIN D 25 HYDROXY (VIT D DEFICIENCY, FRACTURES): VIT D 25 HYDROXY: 21 ng/mL — AB (ref 30–100)

## 2016-03-13 LAB — HIV ANTIBODY (ROUTINE TESTING W REFLEX): HIV: NONREACTIVE

## 2016-03-16 LAB — IPS PAP TEST WITH HPV

## 2016-03-16 LAB — HEMOGLOBIN, FINGERSTICK: HEMOGLOBIN, FINGERSTICK: 13.6 g/dL (ref 12.0–16.0)

## 2016-03-16 LAB — IPS N GONORRHOEA AND CHLAMYDIA BY PCR

## 2016-11-03 ENCOUNTER — Ambulatory Visit (INDEPENDENT_AMBULATORY_CARE_PROVIDER_SITE_OTHER): Payer: 59

## 2016-11-03 ENCOUNTER — Ambulatory Visit (INDEPENDENT_AMBULATORY_CARE_PROVIDER_SITE_OTHER): Payer: 59 | Admitting: Physician Assistant

## 2016-11-03 ENCOUNTER — Telehealth: Payer: Self-pay | Admitting: Physician Assistant

## 2016-11-03 ENCOUNTER — Encounter: Payer: Self-pay | Admitting: Physician Assistant

## 2016-11-03 VITALS — BP 117/78 | HR 69 | Temp 97.7°F | Resp 16 | Ht 67.0 in | Wt 198.8 lb

## 2016-11-03 DIAGNOSIS — M25522 Pain in left elbow: Secondary | ICD-10-CM

## 2016-11-03 MED ORDER — CELECOXIB 100 MG PO CAPS: 100.0000 mg | ORAL_CAPSULE | Freq: Two times a day (BID) | ORAL | 0 refills | Status: DC

## 2016-11-03 NOTE — Progress Notes (Signed)
     Patient ID: Bethlehem Dorow, female    DOB: 1975/09/30, 41 y.o.   MRN: [id number]  PCP: No PCP Per Patient  Chief Complaint  Patient presents with  . Elbow Injury    LEFT and  SWELLING IN HAND x 2 days    Subjective:   Presents for evaluation of left elbow pain.  Pt is a 41 yo AA female presents with 3 days of LEFT elbow pain. Pt states that 3 days ago, she was climbing into her friend's car and hit her left elbow on a plastic portion of a child's car seat. She states that, at that time she had a "funny bone" pain that seemed to last longer than it should have. She then had worsening pain in her left elbow that night.  Pt states that she couldn't move her left arm at all that night and her arm and hand swelled so much that she couldn't get her ring off of her finger. The swelling went down by the next morning. She states that her pain has improved slightly since then and that it is helped with Ibuprofen (400 mg) and Vicks Vapor Rub applied to the area. Today, she denies tingling or numbness in extremities. She is able to extend her left elbow, although painful. Flexion at the elbow causes the most pain. She denies any trauma or surgery on LEFT elbow before this event.   Review of Systems In addition to that stated in HPI above:  Const: Denies fever, chills, fatigue, or weight changes. Pulm: Denies cough or SOB. CV: Denies chest pain or palpitations. Abd: Denies abdominal pain, nausea, vomiting, diarrhea, or constipation.   Patient Active Problem List   Diagnosis Date Noted  . HGSIL (high grade squamous intraepithelial dysplasia) 11/18/2012  . Esophageal reflux 05/16/2009     Prior to Admission medications   Medication Sig Start Date End Date Taking? Authorizing Provider  fluticasone (FLONASE) 50 MCG/ACT nasal spray Place 2 sprays into the nose as needed.   Yes Historical Provider, MD  Multiple Vitamins-Minerals (HAIR SKIN NAILS PO) Take by mouth daily.   Yes Historical Provider,  MD  Multiple Vitamins-Minerals (MULTIVITAMIN PO) Take by mouth daily.   Yes Historical Provider, MD     Allergies  Allergen Reactions  . Latex Hives  . Monistat [Miconazole]     Severe itching  . Prilosec [Omeprazole] Hives       Objective:  Physical Exam  Musculoskeletal:       Arms:  Pain to flexion of left elbow. Full extension, but with pain. Full ROM at right elbow and shoulders bilaterally.  Radial pulses 2+ bilaterally.       Assessment & Plan:   1. Left elbow pain Will get elbow xray Pt advised that, should xray be normal, will treat with Celecoxib BID for two weeks. Pt has history of GERD. - DG ELBOW COMPLETE LEFT (3+VIEW); Future - celecoxib (CELEBREX) 100 MG capsule; Take 1 capsule (100 mg total) by mouth 2 (two) times daily.  Dispense: 60 capsule; Refill: 0  Lorella Nimrod, PA-S

## 2016-11-03 NOTE — Telephone Encounter (Signed)
Spoke with pt. Advised pt that her left elbow xray was normal and that she should pick up her presciption for Celecoxib at the pharmacy. Pt advised to take it for two weeks and see how her pain improves. If she continues to have pain or if her symptoms worsen, pt is advised to call or return to the office.

## 2016-11-03 NOTE — Patient Instructions (Signed)
     IF you received an x-ray today, you will receive an invoice from [facility]. Please contact [facility] at [phone] with questions or concerns regarding your invoice.   IF you received labwork today, you will receive an invoice from [facility]. Please contact [facility] at [phone] with questions or concerns regarding your invoice.   Our billing staff will not be able to assist you with questions regarding bills from these companies.  You will be contacted with the lab results as soon as they are available. The fastest way to get your results is to activate your My Chart account. Instructions are located on the last page of this paperwork. If you have not heard from us regarding the results in 2 weeks, please contact this office.     

## 2016-11-03 NOTE — Progress Notes (Signed)
Patient ID: Christina Hardy, female    DOB: 1976-07-26, 41 y.o.   MRN: [id number]  PCP: No PCP Per Patient  Chief Complaint  Patient presents with  . Elbow Injury    LEFT and  SWELLING IN HAND x 2 days    Subjective:   Presents for evaluation of LEFT elbow pain and swelling of the hand.  3 days ago, while rummaging in the back of a friend's vehicle, she hit her LEFT elbow on the hard plastic portion of a child safety seat. She had the "funny bone" pain and tingling in the elbow and arm that lasted longer than she thought was normal. The tingling resolved, but the pain persisted throughout that day and that night  And she developed swelling of the arm and hand such that she experienced reduced ROM and was unable to wear her rings. The next morning, the swelling had resolved and since then the pain has begun to subside. OTC ibuprofen (400 mg) and topically applied Vick's Vapo Rub helped. Pain increases with full flexion and extension. No previous injury to the LEFT elbow or arm or hand.    Review of Systems As above.    Patient Active Problem List   Diagnosis Date Noted  . HGSIL (high grade squamous intraepithelial dysplasia) 11/18/2012  . Esophageal reflux 05/16/2009     Prior to Admission medications   Medication Sig Start Date End Date Taking? Authorizing Provider  fluticasone (FLONASE) 50 MCG/ACT nasal spray Place 2 sprays into the nose as needed.   Yes Historical Provider, MD  Multiple Vitamins-Minerals (HAIR SKIN NAILS PO) Take by mouth daily.   Yes Historical Provider, MD  Multiple Vitamins-Minerals (MULTIVITAMIN PO) Take by mouth daily.   Yes Historical Provider, MD     Allergies  Allergen Reactions  . Latex Hives  . Monistat [Miconazole]     Severe itching  . Prilosec [Omeprazole] Hives       Objective:  Physical Exam  Constitutional: She is oriented to person, place, and time. She appears well-developed and well-nourished. She is active and cooperative. No  distress.  BP 117/78 (BP Location: Right Arm, Patient Position: Sitting, Cuff Size: Large)   Pulse 69   Temp 97.7 F (36.5 C) (Oral)   Resp 16   Ht 5\' 7"  (1.702 m)   Wt 198 lb 12.8 oz (90.2 kg)   SpO2 97%   BMI 31.14 kg/m    Eyes: Conjunctivae are normal.  Pulmonary/Chest: Effort normal.  Musculoskeletal:       Left shoulder: Normal.       Right elbow: Normal.      Left elbow: She exhibits normal range of motion, no swelling, no effusion, no deformity and no laceration. Tenderness (tenderness is just proximal to the olecranon) found. No radial head, no medial epicondyle, no lateral epicondyle and no olecranon process tenderness noted.       Left wrist: Normal.       Left upper arm: Normal.       Left forearm: Normal.       Left hand: Normal.  Neurological: She is alert and oriented to person, place, and time. She has normal strength. No cranial nerve deficit or sensory deficit.  Psychiatric: She has a normal mood and affect. Her speech is normal and behavior is normal.        Assessment & Plan:   1. Left elbow pain Await radiographs. Trial of celecoxib. Re-evaluate if pain persists in 2 weeks, sooner if  worsens. - DG ELBOW COMPLETE LEFT (3+VIEW); Future - celecoxib (CELEBREX) 100 MG capsule; Take 1 capsule (100 mg total) by mouth 2 (two) times daily.  Dispense: 60 capsule; Refill: 0   Fara Chute, PA-C Physician Assistant[facility] [place]

## 2017-02-02 ENCOUNTER — Telehealth: Payer: Self-pay | Admitting: Certified Nurse Midwife

## 2017-02-02 ENCOUNTER — Encounter: Payer: Self-pay | Admitting: Physician Assistant

## 2017-02-02 ENCOUNTER — Ambulatory Visit (INDEPENDENT_AMBULATORY_CARE_PROVIDER_SITE_OTHER): Payer: 59 | Admitting: Physician Assistant

## 2017-02-02 VITALS — BP 137/90 | HR 74 | Temp 97.4°F | Resp 17 | Ht 67.0 in | Wt 201.0 lb

## 2017-02-02 DIAGNOSIS — B9789 Other viral agents as the cause of diseases classified elsewhere: Secondary | ICD-10-CM

## 2017-02-02 DIAGNOSIS — J069 Acute upper respiratory infection, unspecified: Secondary | ICD-10-CM

## 2017-02-02 MED ORDER — NAPROXEN 500 MG PO TABS: 500.0000 mg | ORAL_TABLET | Freq: Two times a day (BID) | ORAL | 0 refills | Status: DC

## 2017-02-02 MED ORDER — CETIRIZINE-PSEUDOEPHEDRINE ER 5-120 MG PO TB12: 1.0000 | ORAL_TABLET | Freq: Two times a day (BID) | ORAL | 0 refills | Status: DC

## 2017-02-02 NOTE — Telephone Encounter (Signed)
Left patient a message to call back to reschedule a future appointment that was cancelled by the provider for [facility]EX. °

## 2017-02-02 NOTE — Patient Instructions (Signed)
     IF you received an x-ray today, you will receive an invoice from [facility]. Please contact [facility] at [phone] with questions or concerns regarding your invoice.   IF you received labwork today, you will receive an invoice from [facility]. Please contact [facility] at [phone] with questions or concerns regarding your invoice.   Our billing staff will not be able to assist you with questions regarding bills from these companies.  You will be contacted with the lab results as soon as they are available. The fastest way to get your results is to activate your My Chart account. Instructions are located on the last page of this paperwork. If you have not heard from us regarding the results in 2 weeks, please contact this office.     

## 2017-02-02 NOTE — Progress Notes (Signed)
02/02/2017 3:43 PM   DOB: 09/19/1976 / MRN: [id number]  SUBJECTIVE:  Christina Hardy is a 41 y.o. female presenting for allergic like symptoms that started about 5 days ago.  Tells me that she has cough, sore throat, nasal congestion, ear discomfort.  She has tried flonase, chlortab and mucinex and they are giving her no relief.  She is getting worse.   She associates chest congestion today. No history of asthma.   She is allergic to latex; monistat [miconazole]; and prilosec [omeprazole].   She  has a past medical history of Abnormal Pap smear and Allergy.    She  reports that she has never smoked. She has never used smokeless tobacco. She reports that she does not drink alcohol or use drugs. She  reports that she currently engages in sexual activity and has had female partners. She reports using the following method of birth control/protection: IUD. The patient  has a past surgical history that includes Breast reduction surgery (2006); Intrauterine device insertion (09-12-12); Breast surgery; and Colposcopy.  Her family history includes Breast cancer in her mother; Heart disease in her father and paternal grandmother; Heart failure in her father; Hypertension in her maternal grandmother.  Review of Systems  Constitutional: Positive for malaise/fatigue. Negative for chills, diaphoresis and fever.  Respiratory: Positive for cough and sputum production. Negative for hemoptysis, shortness of breath and wheezing.   Cardiovascular: Negative for chest pain, orthopnea and leg swelling.  Gastrointestinal: Negative for nausea.  Skin: Negative for rash.  Neurological: Negative for dizziness.    The problem list and medications were reviewed and updated by myself where necessary and exist elsewhere in the encounter.   OBJECTIVE:  BP 137/90 (BP Location: Right Arm, Patient Position: Sitting, Cuff Size: Normal)   Pulse 74   Temp 97.4 F (36.3 C) (Oral)   Resp 17   Ht 5\' 7"  (1.702 m)   Wt 201 lb (91.2  kg)   SpO2 94%   BMI 31.48 kg/m   Physical Exam  Constitutional: She is active.  Non-toxic appearance.  HENT:  Right Ear: Hearing, tympanic membrane, external ear and ear canal normal.  Left Ear: Hearing, tympanic membrane, external ear and ear canal normal.  Nose: Mucosal edema present. Right sinus exhibits no maxillary sinus tenderness and no frontal sinus tenderness. Left sinus exhibits no maxillary sinus tenderness and no frontal sinus tenderness.  Mouth/Throat: Uvula is midline, oropharynx is clear and moist and mucous membranes are normal. Mucous membranes are not dry. No oropharyngeal exudate, posterior oropharyngeal edema or tonsillar abscesses.  Cardiovascular: Normal rate, regular rhythm, S1 normal, S2 normal, normal heart sounds and intact distal pulses.  Exam reveals no gallop, no friction rub and no decreased pulses.   No murmur heard. Pulmonary/Chest: Effort normal. No stridor. No tachypnea. No respiratory distress. She has no wheezes. She has no rales.  Abdominal: She exhibits no distension.  Musculoskeletal: She exhibits no edema.  Lymphadenopathy:       Head (right side): No submandibular and no tonsillar adenopathy present.       Head (left side): No submandibular and no tonsillar adenopathy present.    She has no cervical adenopathy.  Neurological: She is alert.  Skin: Skin is warm and dry. She is not diaphoretic. No pallor.    No results found for this or any previous visit (from the past 72 hour(s)).  No results found.  ASSESSMENT AND PLAN:  Christina Hardy was seen today for nasal congestion, sore throat, ear fullness, tightness in chest  and cough.  Diagnoses and all orders for this visit:  Viral URI with cough: If she is not better byu day ten I will prescribe an abx.  Will treat symptomatically for now.   Other orders -     cetirizine-pseudoephedrine (ZYRTEC-D) 5-120 MG tablet; Take 1 tablet by mouth 2 (two) times daily. -     naproxen (NAPROSYN) 500 MG tablet;  Take 1 tablet (500 mg total) by mouth 2 (two) times daily with a meal.    The patient is advised to call or return to clinic if she does not see an improvement in symptoms, or to seek the care of the closest emergency department if she worsens with the above plan.   Philis Fendt, MHS, PA-C [facility] 02/02/2017 3:43 PM

## 2017-02-12 ENCOUNTER — Telehealth: Payer: Self-pay | Admitting: Physician Assistant

## 2017-02-12 NOTE — Telephone Encounter (Signed)
If she is not better byu day ten I will prescribe an abx.  Will treat symptomatically for now. (from last ov)

## 2017-02-12 NOTE — Telephone Encounter (Signed)
Pt called saying that Dr. Carlis Abbott would call in an antibiotic for her if she didn't get any better after being seen 02/02/17, she said that if she needed to be seen again that was fine but she was just doing what the doctor told her to do.   Please Advise

## 2017-02-13 ENCOUNTER — Other Ambulatory Visit: Payer: Self-pay | Admitting: Physician Assistant

## 2017-02-13 MED ORDER — AMOXICILLIN 875 MG PO TABS: 875.0000 mg | ORAL_TABLET | Freq: Two times a day (BID) | ORAL | 0 refills | Status: DC

## 2017-02-13 NOTE — Telephone Encounter (Signed)
Sending in amox now.  Please let her know. Philis Fendt, MS, PA-C 12:47 PM, 02/13/2017

## 2017-02-13 NOTE — Telephone Encounter (Signed)
Pt advised.

## 2017-02-13 NOTE — Progress Notes (Signed)
Patient without improvement with NSIADs, decongestant and time.  Amox sent to pharmacy electronically.  Patient made aware via phone call.  Philis Fendt, MS, PA-C 12:49 PM, 02/13/2017

## 2017-03-17 ENCOUNTER — Ambulatory Visit: Payer: 59 | Admitting: Certified Nurse Midwife

## 2017-04-08 ENCOUNTER — Other Ambulatory Visit (HOSPITAL_COMMUNITY)
Admission: RE | Admit: 2017-04-08 | Discharge: 2017-04-08 | Disposition: A | Discharge status: Home / Self Care | Payer: 59 | Source: Ambulatory Visit | Attending: Certified Nurse Midwife | Admitting: Certified Nurse Midwife

## 2017-04-08 ENCOUNTER — Encounter: Payer: Self-pay | Admitting: Certified Nurse Midwife

## 2017-04-08 ENCOUNTER — Ambulatory Visit (INDEPENDENT_AMBULATORY_CARE_PROVIDER_SITE_OTHER): Payer: 59 | Admitting: Certified Nurse Midwife

## 2017-04-08 VITALS — BP 104/68 | HR 64 | Resp 16 | Ht 66.75 in | Wt 205.0 lb

## 2017-04-08 DIAGNOSIS — N6310 Unspecified lump in the right breast, unspecified quadrant: Secondary | ICD-10-CM | POA: Diagnosis not present

## 2017-04-08 DIAGNOSIS — Z0000 Encounter for general adult medical examination without abnormal findings: Secondary | ICD-10-CM

## 2017-04-08 DIAGNOSIS — Z01419 Encounter for gynecological examination (general) (routine) without abnormal findings: Secondary | ICD-10-CM | POA: Diagnosis not present

## 2017-04-08 DIAGNOSIS — Z975 Presence of (intrauterine) contraceptive device: Secondary | ICD-10-CM | POA: Insufficient documentation

## 2017-04-08 DIAGNOSIS — Z124 Encounter for screening for malignant neoplasm of cervix: Secondary | ICD-10-CM | POA: Diagnosis not present

## 2017-04-08 NOTE — Patient Instructions (Signed)

## 2017-04-08 NOTE — Progress Notes (Signed)
Scheduled patient while in office for bilateral diagnotic imaging and [facility] 04[facility]12[facility]2017 at 8 am. Patient is agreeable to date and time. Placed in mammogram hold.

## 2017-04-08 NOTE — Progress Notes (Signed)
41 y.o. G0P0000 Single  African American Fe here for annual exam. Periods scant to none with IUD. Sexually active in past year, not now. Desires screening labs and STD screening. Busy with work and exercise. Doing well. No health issues today. Ready to schedule mammogram now. Mother had breast cancer at age 47, ? Genetic screening done. No other health issues today.  No LMP recorded. Patient is not currently having periods (Reason: IUD).          Sexually active: No.  The current method of family planning is IUD.  Due out 12/18  Exercising: Yes.    walking, jogging & swimming Smoker:  no  Health Maintenance: Pap:  3/14 ASCUS HPV HR +, 03-12-16 neg HPV HR neg History of Abnormal Pap: yes, had colpo but declined LEEP MMG:  Yrs ago Self Breast exams: done occ Colonoscopy:  none BMD:   none TDaP:  2013 Shingles: no Pneumonia: no Hep C and HIV: both neg 2017 Labs: no   reports that she has never smoked. She has never used smokeless tobacco. She reports that she does not drink alcohol or use drugs.  Past Medical History:  Diagnosis Date  . Abnormal Pap smear    yrs ago  . Allergy     Past Surgical History:  Procedure Laterality Date  . BREAST REDUCTION SURGERY  2006  . BREAST SURGERY    . COLPOSCOPY    . INTRAUTERINE DEVICE INSERTION  09-12-12   mirena insertion    Current Outpatient Prescriptions  Medication Sig Dispense Refill  . fluticasone (FLONASE) 50 MCG/ACT nasal spray Place into both nostrils daily.    . Multiple Vitamins-Minerals (HAIR SKIN NAILS PO) Take by mouth daily.    . Multiple Vitamins-Minerals (MULTIVITAMIN PO) Take by mouth daily.     No current facility-administered medications for this visit.     Family History  Problem Relation Age of Onset  . Heart failure Father   . Heart disease Father   . Breast cancer Mother   . Hypertension Maternal Grandmother   . Heart disease Paternal Grandmother     ROS:  Pertinent items are noted in HPI.  Otherwise, a  comprehensive ROS was negative.  Exam:   BP 104/68   Pulse 64   Resp 16   Ht 5' 6.75" (1.695 m)   Wt 205 lb (93 kg)   BMI 32.35 kg/m  Height: 5' 6.75" (169.5 cm) Ht Readings from Last 3 Encounters:  04/08/17 5' 6.75" (1.695 m)  02/02/17 5\' 7"  (1.702 m)  11/03/16 5\' 7"  (1.702 m)    General appearance: alert, cooperative and appears stated age Head: Normocephalic, without obvious abnormality, atraumatic Neck: no adenopathy, supple, symmetrical, trachea midline and thyroid normal to inspection and palpation Lungs: clear to auscultation bilaterally Breasts: normal appearance, no masses or tenderness, No nipple retraction or dimpling, No nipple discharge or bleeding, No axillary or supraclavicular adenopathy , right breast at 9 o'clock mass noted, firm, ? Scar tissue, implants feel intact silicone Heart: regular rate and rhythm Abdomen: soft, non-tender; no masses,  no organomegaly Extremities: extremities normal, atraumatic, no cyanosis or edema Skin: Skin color, texture, turgor normal. No rashes or lesions Lymph nodes: Cervical, supraclavicular, and axillary nodes normal. No abnormal inguinal nodes palpated Neurologic: Grossly normal   Pelvic: External genitalia:  no lesions              Urethra:  normal appearing urethra with no masses, tenderness or lesions  Bartholin's and Skene's: normal                 Vagina: normal appearing vagina with normal color and discharge, no lesions              Cervix: no cervical motion tenderness, no lesions, nulliparous appearance and iud string noted in cervix              Pap taken: yes Bimanual Exam:  Uterus:  normal size, contour, position, consistency, mobility, non-tender              Adnexa: normal adnexa and no mass, fullness, tenderness               Rectovaginal: Confirms               Anus:  normal sphincter tone, no lesions  Chaperone present: yes  A:  Well Woman with normal exam  Contraception Mirena IUD due for  removal 12/18, not sure if she will renew, probably will  Right breast mass ? Scar tissue from previous reduction  Family history of breast cancer mother age 71 ? Genetic screening done  Screening labs  P:   Reviewed health and wellness pertinent to exam  Discussed warning signs of IUD and needs to advise if occurs. Discussed will need to call in 11/18 to schedule removal of IUD in 12/18 if desires another IUD or other contraception. Patient agreeable.  Discussed ? Breast mass vs scar tissue, and need for Diagnostic mammogram and [place]. Patient agreeable. Discussed talking with mother to see if genetic screening done at time of surgery. Discussed patient have done and also genetic consult. Patient will ask mother and consider consult and advise. Stressed mammogram yearly and SBE.  Screening labs:CBC, Hep C, HIV, Lipid panel, TSH, RPR, Vitamin D  Pap smear: yes   counseled on breast self exam, mammography screening, STD prevention, adequate intake of calcium and vitamin D, diet and exercise  return annually or prn  An After Visit Summary was printed and given to the patient.

## 2017-04-09 LAB — LIPID PANEL
CHOLESTEROL TOTAL: 163 mg/dL (ref 100–199)
Chol/HDL Ratio: 3.1 ratio (ref 0.0–4.4)
HDL: 53 mg/dL (ref >39)
LDL Calculated: 96 mg/dL (ref 0–99)
Triglycerides: 68 mg/dL (ref 0–149)
VLDL CHOLESTEROL CAL: 14 mg/dL (ref 5–40)

## 2017-04-09 LAB — COMPREHENSIVE METABOLIC PANEL
ALBUMIN: 4.2 g/dL (ref 3.5–5.5)
ALK PHOS: 86 IU/L (ref 39–117)
ALT: 15 IU/L (ref 0–32)
AST: 20 IU/L (ref 0–40)
Albumin/Globulin Ratio: 1.3 (ref 1.2–2.2)
BUN / CREAT RATIO: 11 (ref 9–23)
BUN: 10 mg/dL (ref 6–24)
Bilirubin Total: 0.4 mg/dL (ref 0.0–1.2)
CO2: 23 mmol/L (ref 20–29)
CREATININE: 0.93 mg/dL (ref 0.57–1.00)
Calcium: 9.3 mg/dL (ref 8.7–10.2)
Chloride: 103 mmol/L (ref 96–106)
GFR calc non Af Amer: 76 mL/min/{1.73_m2} (ref >59)
GFR, EST AFRICAN AMERICAN: 88 mL/min/{1.73_m2} (ref >59)
GLOBULIN, TOTAL: 3.3 g/dL (ref 1.5–4.5)
Glucose: 77 mg/dL (ref 65–99)
Potassium: 4.9 mmol/L (ref 3.5–5.2)
SODIUM: 141 mmol/L (ref 134–144)
Total Protein: 7.5 g/dL (ref 6.0–8.5)

## 2017-04-09 LAB — RPR: RPR Ser Ql: NONREACTIVE

## 2017-04-09 LAB — CYTOLOGY - PAP
Bacterial vaginitis: NEGATIVE
Candida vaginitis: NEGATIVE
Chlamydia: NEGATIVE
DIAGNOSIS: NEGATIVE
Neisseria Gonorrhea: NEGATIVE
Trichomonas: NEGATIVE

## 2017-04-09 LAB — CBC
HEMATOCRIT: 41.3 % (ref 34.0–46.6)
HEMOGLOBIN: 13.2 g/dL (ref 11.1–15.9)
MCH: 29.3 pg (ref 26.6–33.0)
MCHC: 32 g/dL (ref 31.5–35.7)
MCV: 92 fL (ref 79–97)
Platelets: 271 10*3/uL (ref 150–379)
RBC: 4.5 x10E6/uL (ref 3.77–5.28)
RDW: 13.6 % (ref 12.3–15.4)
WBC: 6.6 10*3/uL (ref 3.4–10.8)

## 2017-04-09 LAB — HEPATITIS C ANTIBODY: Hep C Virus Ab: <0.1 s/co ratio (ref 0.0–0.9)

## 2017-04-09 LAB — HIV ANTIBODY (ROUTINE TESTING W REFLEX): HIV SCREEN 4TH GENERATION: NONREACTIVE

## 2017-04-09 LAB — TSH: TSH: 0.937 u[IU]/mL (ref 0.450–4.500)

## 2017-04-09 LAB — VITAMIN D 25 HYDROXY (VIT D DEFICIENCY, FRACTURES): VIT D 25 HYDROXY: 30.7 ng/mL (ref 30.0–100.0)

## 2017-04-12 ENCOUNTER — Ambulatory Visit
Admission: RE | Admit: 2017-04-12 | Discharge: 2017-04-12 | Disposition: A | Discharge status: Home / Self Care | Payer: 59 | Source: Ambulatory Visit | Attending: Certified Nurse Midwife | Admitting: Certified Nurse Midwife

## 2017-04-12 DIAGNOSIS — N6310 Unspecified lump in the right breast, unspecified quadrant: Secondary | ICD-10-CM

## 2017-04-28 ENCOUNTER — Ambulatory Visit (INDEPENDENT_AMBULATORY_CARE_PROVIDER_SITE_OTHER): Payer: 59 | Admitting: Certified Nurse Midwife

## 2017-04-28 ENCOUNTER — Encounter: Payer: Self-pay | Admitting: Certified Nurse Midwife

## 2017-04-28 VITALS — BP 112/78 | HR 64 | Resp 16 | Ht 66.75 in | Wt 208.0 lb

## 2017-04-28 DIAGNOSIS — Z1231 Encounter for screening mammogram for malignant neoplasm of breast: Secondary | ICD-10-CM

## 2017-04-28 DIAGNOSIS — Z1239 Encounter for other screening for malignant neoplasm of breast: Secondary | ICD-10-CM

## 2017-04-28 NOTE — Progress Notes (Signed)
   Subjective:   41 y.o. SingleAfrican American female presents for follow up of breast mass noted in right breast. Patient has breast reduction scarring with suspect scar tissue on right , but noted at 9 o'clock. Patient had diagnostic mammogram with [place] and no mass noted just normal fibroglandular tissue in area of concern with scarring. Review of Systems Pertinent items are noted in HPI.   Objective:   General appearance: alert, cooperative, appears stated age and no distress Breasts: normal appearance, no masses or tenderness, Inspection negative, No nipple retraction or dimpling, No nipple discharge or bleeding, No axillary or supraclavicular adenopathy, area of concern still palpated, but more scar related on right. Reviewed SBE with patient and palpated area and will doe SBE and advise if any change.      Assessment:   ASSESSMENT:Patient is diagnosed with fibroglandular tissue in area of scarring from breast reduction on right breast   Plan:   PLAN: The patient has a documented plan to follow with further care of repeat mammogram in one year.Marland Kitchen SBE monthly and advise if any change.

## 2018-04-12 ENCOUNTER — Ambulatory Visit: Payer: 59 | Admitting: Certified Nurse Midwife

## 2018-05-11 ENCOUNTER — Other Ambulatory Visit: Payer: Self-pay | Admitting: Certified Nurse Midwife

## 2018-05-11 DIAGNOSIS — Z1231 Encounter for screening mammogram for malignant neoplasm of breast: Secondary | ICD-10-CM

## 2018-06-01 ENCOUNTER — Ambulatory Visit: Payer: BLUE CROSS/BLUE SHIELD | Admitting: Certified Nurse Midwife

## 2018-06-01 ENCOUNTER — Ambulatory Visit
Admission: RE | Admit: 2018-06-01 | Discharge: 2018-06-01 | Disposition: A | Discharge status: Home / Self Care | Payer: BLUE CROSS/BLUE SHIELD | Source: Ambulatory Visit

## 2018-06-01 ENCOUNTER — Other Ambulatory Visit (HOSPITAL_COMMUNITY)
Admission: RE | Admit: 2018-06-01 | Discharge: 2018-06-01 | Disposition: A | Discharge status: Home / Self Care | Payer: BLUE CROSS/BLUE SHIELD | Source: Ambulatory Visit | Attending: Certified Nurse Midwife | Admitting: Certified Nurse Midwife

## 2018-06-01 ENCOUNTER — Encounter: Payer: Self-pay | Admitting: Certified Nurse Midwife

## 2018-06-01 ENCOUNTER — Other Ambulatory Visit: Payer: Self-pay

## 2018-06-01 VITALS — BP 110/70 | HR 70 | Resp 16 | Wt 197.0 lb

## 2018-06-01 DIAGNOSIS — Z124 Encounter for screening for malignant neoplasm of cervix: Secondary | ICD-10-CM | POA: Diagnosis present

## 2018-06-01 DIAGNOSIS — Z113 Encounter for screening for infections with a predominantly sexual mode of transmission: Secondary | ICD-10-CM | POA: Diagnosis not present

## 2018-06-01 DIAGNOSIS — Z01411 Encounter for gynecological examination (general) (routine) with abnormal findings: Secondary | ICD-10-CM

## 2018-06-01 DIAGNOSIS — Z30431 Encounter for routine checking of intrauterine contraceptive device: Secondary | ICD-10-CM

## 2018-06-01 DIAGNOSIS — Z1231 Encounter for screening mammogram for malignant neoplasm of breast: Secondary | ICD-10-CM

## 2018-06-01 DIAGNOSIS — E049 Nontoxic goiter, unspecified: Secondary | ICD-10-CM

## 2018-06-01 NOTE — Progress Notes (Signed)
42 y.o. G0P0000 Single  African American Fe here for annual exam. Contraception Mirena IUD, periods none with IUD, denies warning signs with use.  Has been working on weight loss and down 11 pounds! Sees  Dr. Carlis Abbott prn only. Had Mirena IUD replaced in 10/05/2017 at [facility]. Desires STD screening. Considering donating a kidney for a friend. No other health issues today.  No LMP recorded. (Menstrual status: IUD).          Sexually active: No.  The current method of family planning is IUD.    Exercising: Yes.    cardio & weights Smoker:  no  Review of Systems  Constitutional: Negative.   HENT: Negative.   Eyes: Negative.   Respiratory: Negative.   Cardiovascular: Negative.   Gastrointestinal: Negative.   Genitourinary: Negative.   Musculoskeletal: Negative.   Skin: Negative.   Neurological: Negative.   Endo/Heme/Allergies: Negative.   Psychiatric/Behavioral: Negative.     Health Maintenance: Pap:  03-12-16 neg HPV HR neg, 04-08-17 neg History of Abnormal Pap: yes, had colpo but declined LEEP MMG:  06-01-18 category b density birads 1:neg Self Breast exams: occ Colonoscopy: none BMD:   none TDaP:  2013 Shingles: no Pneumonia: no Hep C and HIV: both neg 2018 Labs: if needed   reports that she has never smoked. She has never used smokeless tobacco. She reports that she drinks alcohol. She reports that she does not use drugs.  Past Medical History:  Diagnosis Date  . Abnormal Pap smear    yrs ago  . Allergy     Past Surgical History:  Procedure Laterality Date  . BREAST REDUCTION SURGERY  2006  . BREAST SURGERY    . COLPOSCOPY    . INTRAUTERINE DEVICE INSERTION  09-12-12   mirena insertion  . REDUCTION MAMMAPLASTY Bilateral 2006    Current Outpatient Medications  Medication Sig Dispense Refill  . fluticasone (FLONASE) 50 MCG/ACT nasal spray Place into both nostrils daily.    . Multiple Vitamins-Minerals (HAIR SKIN NAILS PO) Take by mouth daily.    . Multiple  Vitamins-Minerals (MULTIVITAMIN PO) Take by mouth daily.     No current facility-administered medications for this visit.     Family History  Problem Relation Age of Onset  . Heart failure Father   . Heart disease Father   . Breast cancer Mother 45  . Hypertension Maternal Grandmother   . Heart disease Paternal Grandmother     ROS:  Pertinent items are noted in HPI.  Otherwise, a comprehensive ROS was negative.  Exam:   BP 110/70   Pulse 70   Resp 16   Wt 197 lb (89.4 kg)   BMI 31.09 kg/m    Ht Readings from Last 3 Encounters:  04/28/17 5' 6.75" (1.695 m)  04/08/17 5' 6.75" (1.695 m)  02/02/17 5\' 7"  (1.702 m)    General appearance: alert, cooperative and appears stated age Head: Normocephalic, without obvious abnormality, atraumatic Neck: no adenopathy, supple, symmetrical, trachea midline and thyroid enlarged and nodular Lungs: clear to auscultation bilaterally Breasts: normal appearance, no masses or tenderness, No nipple retraction or dimpling, No nipple discharge or bleeding, No axillary or supraclavicular adenopathy Heart: regular rate and rhythm Abdomen: soft, non-tender; no masses,  no organomegaly Extremities: extremities normal, atraumatic, no cyanosis or edema Skin: Skin color, texture, turgor normal. No rashes or lesions Lymph nodes: Cervical, supraclavicular, and axillary nodes normal. No abnormal inguinal nodes palpated Neurologic: Grossly normal   Pelvic: External genitalia:  no lesions, normal  female              Urethra:  normal appearing urethra with no masses, tenderness or lesions              Bartholin's and Skene's: normal                 Vagina: normal appearing vagina with normal color and discharge, no lesions              Cervix: no cervical motion tenderness, no lesions and IUD string noted in cervical os              Pap taken: Yes.   Bimanual Exam:  Uterus:  normal size, contour, position, consistency, mobility, non-tender and anteverted               Adnexa: normal adnexa and no mass, fullness, tenderness               Rectovaginal: Confirms               Anus:  normal sphincter tone, no lesions  Chaperone present: yes  A:  Well Woman with normal exam  Contraception Mirena IUD due for removal 09/2021  Enlarged thyroid, nodular feel  Screening labs  P:   Reviewed health and wellness pertinent to exam  Risks/benefits/warnings signs reviewed  Discussed finding and etiology and need for evaluation with [place]. Patient agreeable. Patient will be called with information and scheduled.  Lab: TSH with panel  Labs: STD panel, Hep C,Gc/Chlamydia,Affirm  Pap smear: yes   counseled on breast self exam, mammography screening, STD prevention, HIV risk factors and prevention, adequate intake of calcium and vitamin D, diet and exercise  return annually or prn  An After Visit Summary was printed and given to the patient.

## 2018-06-01 NOTE — Patient Instructions (Signed)

## 2018-06-02 ENCOUNTER — Other Ambulatory Visit: Payer: Self-pay | Admitting: *Deleted

## 2018-06-02 DIAGNOSIS — E049 Nontoxic goiter, unspecified: Secondary | ICD-10-CM

## 2018-06-02 LAB — HEPATITIS C ANTIBODY

## 2018-06-02 LAB — GC/CHLAMYDIA PROBE AMP
Chlamydia trachomatis, NAA: NEGATIVE
NEISSERIA GONORRHOEAE BY PCR: NEGATIVE

## 2018-06-02 LAB — THYROID PANEL WITH TSH
Free Thyroxine Index: 2.1 (ref 1.2–4.9)
T3 Uptake Ratio: 29 % (ref 24–39)
T4, Total: 7.3 ug/dL (ref 4.5–12.0)
TSH: 1.05 u[IU]/mL (ref 0.450–4.500)

## 2018-06-02 LAB — HEP, RPR, HIV PANEL
HIV SCREEN 4TH GENERATION: NONREACTIVE
Hepatitis B Surface Ag: NEGATIVE
RPR Ser Ql: NONREACTIVE

## 2018-06-02 LAB — VAGINITIS/VAGINOSIS, DNA PROBE
Candida Species: NEGATIVE
GARDNERELLA VAGINALIS: NEGATIVE
TRICHOMONAS VAG: NEGATIVE

## 2018-06-03 LAB — CYTOLOGY - PAP
DIAGNOSIS: NEGATIVE
HPV (WINDOPATH): NOT DETECTED

## 2018-06-08 ENCOUNTER — Telehealth: Payer: Self-pay | Admitting: Certified Nurse Midwife

## 2018-06-08 NOTE — Telephone Encounter (Signed)
Order placed to [facility] for a thyroid ultrasound. Spoke with Vita Barley with [facility]. She advised they left a voicemail message on 06/02/18 requesting a return call to schedule. To date patient has not returned their call.  I have also placed a call today, to the patient and left a voicemail message requesting a return call. Upon return call, I will encourage patient to call [facility] at [phone] to schedule the recommended thyroid ultrasound.    cc: Melvia Heaps, CNM

## 2018-06-09 NOTE — Telephone Encounter (Signed)
Thank you :)

## 2018-06-13 NOTE — Telephone Encounter (Signed)
Second call from our office, has been placed to patient to follow up in regards to scheduling recommended thyroid ultrasound. Left voicemail message requesting a return call.

## 2018-06-16 NOTE — Telephone Encounter (Signed)
Third call placed to patient to follow up in regards to scheduling recommended thyroid ultrasound. Left voicemail message requesting a return call    cc: Riley Kill, CNM  cc: Lamont Snowball, RN

## 2018-06-22 NOTE — Telephone Encounter (Signed)
She is aware of our recommendations. I feel we can close encounter

## 2018-06-23 NOTE — Telephone Encounter (Signed)
See Melvia Heaps, CNM 06/22/18 phone note response. Will close encounter

## 2018-07-06 ENCOUNTER — Telehealth: Payer: Self-pay | Admitting: Certified Nurse Midwife

## 2018-07-06 NOTE — Telephone Encounter (Signed)
See previous messages. Patient returned call today. Advised patient were contracting her to encourage her to return calls to [facility]. I have provided patient [place] phone number [phone]. Patient is agreeable to scheduling   cc: Melvia Heaps, CNM

## 2018-07-11 NOTE — Telephone Encounter (Signed)
Patient is scheduled for thyroid ultrasound with [facility] on 07/22/18.  Will close encounter   cc: Melvia Heaps, CNM

## 2018-07-22 ENCOUNTER — Ambulatory Visit
Admission: RE | Admit: 2018-07-22 | Discharge: 2018-07-22 | Disposition: A | Discharge status: Home / Self Care | Payer: BLUE CROSS/BLUE SHIELD | Source: Ambulatory Visit | Attending: Certified Nurse Midwife | Admitting: Certified Nurse Midwife

## 2018-07-22 DIAGNOSIS — E049 Nontoxic goiter, unspecified: Secondary | ICD-10-CM

## 2018-07-25 ENCOUNTER — Other Ambulatory Visit: Payer: Self-pay | Admitting: *Deleted

## 2018-07-25 DIAGNOSIS — E041 Nontoxic single thyroid nodule: Secondary | ICD-10-CM

## 2018-08-01 ENCOUNTER — Telehealth: Payer: Self-pay | Admitting: Certified Nurse Midwife

## 2018-08-01 NOTE — Telephone Encounter (Signed)
Patient is calling regarding her referral. Patient is asking for an update.

## 2018-08-23 ENCOUNTER — Other Ambulatory Visit: Payer: Self-pay | Admitting: Surgery

## 2018-08-23 DIAGNOSIS — E041 Nontoxic single thyroid nodule: Secondary | ICD-10-CM

## 2018-08-23 NOTE — Telephone Encounter (Signed)
Rosa, okay to close this encounter?

## 2018-09-06 ENCOUNTER — Other Ambulatory Visit (HOSPITAL_COMMUNITY)
Admission: RE | Admit: 2018-09-06 | Discharge: 2018-09-06 | Disposition: A | Discharge status: Home / Self Care | Payer: BLUE CROSS/BLUE SHIELD | Source: Ambulatory Visit | Attending: Radiology | Admitting: Radiology

## 2018-09-06 ENCOUNTER — Ambulatory Visit
Admission: RE | Admit: 2018-09-06 | Discharge: 2018-09-06 | Disposition: A | Discharge status: Home / Self Care | Payer: BLUE CROSS/BLUE SHIELD | Source: Ambulatory Visit | Attending: Surgery | Admitting: Surgery

## 2018-09-06 DIAGNOSIS — E041 Nontoxic single thyroid nodule: Secondary | ICD-10-CM | POA: Insufficient documentation

## 2018-09-20 ENCOUNTER — Encounter (HOSPITAL_COMMUNITY): Payer: Self-pay

## 2018-10-06 ENCOUNTER — Encounter (HOSPITAL_COMMUNITY): Payer: Self-pay

## 2018-11-15 ENCOUNTER — Telehealth: Payer: Self-pay | Admitting: Certified Nurse Midwife

## 2018-11-16 NOTE — Telephone Encounter (Signed)
error 

## 2019-05-08 ENCOUNTER — Other Ambulatory Visit: Payer: Self-pay | Admitting: Certified Nurse Midwife

## 2019-05-08 DIAGNOSIS — Z1231 Encounter for screening mammogram for malignant neoplasm of breast: Secondary | ICD-10-CM

## 2019-05-31 NOTE — Progress Notes (Signed)
43 y.o. G0P0000 Single  African American Fe here for annual exam. Has noted some weight gain(9lbs.) since last visit with eating differently.  Working remotely as needed, now in office. Periods none with Mirena IUD. Working well. Getting married next year and will plan for pregnancy also after that time. Thyroid nodule biopsy in 08/2018 which was benign. Does not feel different. Will need follow up in 08/2019, she is aware to schedule appointment with Dr. Harlow Asa.. Occasional hot flash once monthly, no other issues. See Urgent care if needed. Screening labs today requested.   No LMP recorded. (Menstrual status: IUD).          Sexually active: Yes.    The current method of family planning is IUD, Inserted 10/05/17 Family Planning Exercising: No.  exercise Smoker:  no  Review of Systems  Constitutional:       Thyroid? Hot/cold intolerance  HENT: Negative.   Eyes: Negative.   Respiratory: Negative.   Cardiovascular: Negative.   Gastrointestinal: Negative.   Genitourinary: Negative.   Musculoskeletal: Negative.   Skin: Negative.   Neurological: Negative.   Endo/Heme/Allergies: Negative.   Psychiatric/Behavioral: Negative.     Health Maintenance: Pap:  04-08-17 neg, 06-01-18 neg HPV HR neg History of Abnormal Pap: yes, had colpo but declined LEEP MMG:  06-01-18 category b density birads 1:neg, scheduled for today Self Breast exams: yes Colonoscopy: none BMD:   none TDaP:  2013 Shingles: no Pneumonia: no Hep C and HIV: both neg 2019 Labs: yes   reports that she has never smoked. She has never used smokeless tobacco. She reports current alcohol use. She reports that she does not use drugs.  Past Medical History:  Diagnosis Date  . Abnormal Pap smear    yrs ago  . Allergy     Past Surgical History:  Procedure Laterality Date  . BREAST REDUCTION SURGERY  2006  . BREAST SURGERY    . COLPOSCOPY    . INTRAUTERINE DEVICE INSERTION  09-12-12   mirena insertion  . REDUCTION MAMMAPLASTY  Bilateral 2006    Current Outpatient Medications  Medication Sig Dispense Refill  . fluticasone (FLONASE) 50 MCG/ACT nasal spray Place into both nostrils daily.    . Multiple Vitamins-Minerals (HAIR SKIN NAILS PO) Take by mouth daily.    . Multiple Vitamins-Minerals (MULTIVITAMIN PO) Take by mouth daily.     No current facility-administered medications for this visit.     Family History  Problem Relation Age of Onset  . Heart failure Father   . Heart disease Father   . Breast cancer Mother 7  . Hypertension Maternal Grandmother   . Heart disease Paternal Grandmother     ROS:  Pertinent items are noted in HPI.  Otherwise, a comprehensive ROS was negative.  Exam:   BP 110/70   Pulse 68   Temp (!) 97.2 F (36.2 C) (Skin)   Resp 16   Ht 5\' 7"  (1.702 m)   Wt 206 lb (93.4 kg)   BMI 32.26 kg/m  Height: 5\' 7"  (170.2 cm) Ht Readings from Last 3 Encounters:  06/07/19 5\' 7"  (1.702 m)  04/28/17 5' 6.75" (1.695 m)  04/08/17 5' 6.75" (1.695 m)    General appearance: alert, cooperative and appears stated age Head: Normocephalic, without obvious abnormality, atraumatic Neck: no adenopathy, supple, symmetrical, trachea midline and thyroid nodule on right , no change in size Lungs: clear to auscultation bilaterally Breasts: normal appearance, no masses or tenderness, No nipple retraction or dimpling, No nipple discharge or  bleeding, No axillary or supraclavicular adenopathy, breast reduction scarring bilateral Heart: regular rate and rhythm Abdomen: soft, non-tender; no masses,  no organomegaly Extremities: extremities normal, atraumatic, no cyanosis or edema Skin: Skin color, texture, turgor normal. No rashes or lesions Lymph nodes: Cervical, supraclavicular, and axillary nodes normal. No abnormal inguinal nodes palpated Neurologic: Grossly normal   Pelvic: External genitalia:  no lesions, normal female              Urethra:  normal appearing urethra with no masses, tenderness  or lesions              Bartholin's and Skene's: normal                 Vagina: normal appearing vagina with normal color and discharge, no lesions              Cervix: no cervical motion tenderness, no lesions and normal appearance              Pap taken: Yes.   Bimanual Exam:  Uterus:  normal size, contour, position, consistency, mobility, non-tender and anteverted              Adnexa: normal adnexa and no mass, fullness, tenderness               Rectovaginal: Confirms               Anus:  normal sphincter tone, no lesions  Chaperone present: yes  A:  Well Woman with normal exam  Contraception Mirena IUD due for removal  10/05/22  Thyroid nodule on right (biopsy 2019 benign) under follow up  Screening labs  P:   Reviewed health and wellness pertinent to exam  Warning signs/symptoms/bleeding expectations of IUD reviewed.  Stressed importance of follow up with surgeon in 08/2019.  Lab: Lipid, CMP,CBC, TSH,Hep C,HIV, RPR, Affirm, Gc/Chlamydia, Vitamin D  Pap smear: yes   counseled on breast self exam, mammography screening, STD prevention, HIV risk factors and prevention, adequate intake of calcium and vitamin D, diet and exercise. Wished well with upcoming marriage!  return annually or prn  An After Visit Summary was printed and given to the patient.

## 2019-06-02 ENCOUNTER — Other Ambulatory Visit: Payer: Self-pay

## 2019-06-07 ENCOUNTER — Other Ambulatory Visit (HOSPITAL_COMMUNITY)
Admission: RE | Admit: 2019-06-07 | Discharge: 2019-06-07 | Disposition: A | Discharge status: Home / Self Care | Payer: 59 | Source: Ambulatory Visit | Attending: Certified Nurse Midwife | Admitting: Certified Nurse Midwife

## 2019-06-07 ENCOUNTER — Ambulatory Visit: Payer: 59 | Admitting: Certified Nurse Midwife

## 2019-06-07 ENCOUNTER — Ambulatory Visit
Admission: RE | Admit: 2019-06-07 | Discharge: 2019-06-07 | Disposition: A | Discharge status: Home / Self Care | Payer: 59 | Source: Ambulatory Visit | Attending: Certified Nurse Midwife | Admitting: Certified Nurse Midwife

## 2019-06-07 ENCOUNTER — Encounter: Payer: Self-pay | Admitting: Certified Nurse Midwife

## 2019-06-07 ENCOUNTER — Other Ambulatory Visit: Payer: Self-pay

## 2019-06-07 VITALS — BP 110/70 | HR 68 | Temp 97.2°F | Resp 16 | Ht 67.0 in | Wt 206.0 lb

## 2019-06-07 DIAGNOSIS — Z0000 Encounter for general adult medical examination without abnormal findings: Secondary | ICD-10-CM

## 2019-06-07 DIAGNOSIS — Z8639 Personal history of other endocrine, nutritional and metabolic disease: Secondary | ICD-10-CM

## 2019-06-07 DIAGNOSIS — Z113 Encounter for screening for infections with a predominantly sexual mode of transmission: Secondary | ICD-10-CM | POA: Insufficient documentation

## 2019-06-07 DIAGNOSIS — Z8742 Personal history of other diseases of the female genital tract: Secondary | ICD-10-CM

## 2019-06-07 DIAGNOSIS — Z01419 Encounter for gynecological examination (general) (routine) without abnormal findings: Secondary | ICD-10-CM | POA: Diagnosis not present

## 2019-06-07 DIAGNOSIS — Z124 Encounter for screening for malignant neoplasm of cervix: Secondary | ICD-10-CM

## 2019-06-07 DIAGNOSIS — R635 Abnormal weight gain: Secondary | ICD-10-CM

## 2019-06-07 DIAGNOSIS — Z1231 Encounter for screening mammogram for malignant neoplasm of breast: Secondary | ICD-10-CM

## 2019-06-07 NOTE — Patient Instructions (Signed)

## 2019-06-08 LAB — CYTOLOGY - PAP
Chlamydia: NEGATIVE
Diagnosis: NEGATIVE
HPV: NOT DETECTED
Neisseria Gonorrhea: NEGATIVE

## 2019-06-08 LAB — THYROID PANEL WITH TSH
Free Thyroxine Index: 1.6 (ref 1.2–4.9)
T3 Uptake Ratio: 29 % (ref 24–39)
T4, Total: 5.6 ug/dL (ref 4.5–12.0)
TSH: 1.25 u[IU]/mL (ref 0.450–4.500)

## 2019-06-08 LAB — CBC
Hematocrit: 44.2 % (ref 34.0–46.6)
Hemoglobin: 13.6 g/dL (ref 11.1–15.9)
MCH: 29.4 pg (ref 26.6–33.0)
MCHC: 30.8 g/dL — ABNORMAL LOW (ref 31.5–35.7)
MCV: 96 fL (ref 79–97)
Platelets: 254 10*3/uL (ref 150–450)
RBC: 4.63 x10E6/uL (ref 3.77–5.28)
RDW: 13 % (ref 11.7–15.4)
WBC: 5.5 10*3/uL (ref 3.4–10.8)

## 2019-06-08 LAB — LIPID PANEL
Chol/HDL Ratio: 4 ratio (ref 0.0–4.4)
Cholesterol, Total: 198 mg/dL (ref 100–199)
HDL: 50 mg/dL (ref >39)
LDL Chol Calc (NIH): 134 mg/dL — ABNORMAL HIGH (ref 0–99)
Triglycerides: 79 mg/dL (ref 0–149)
VLDL Cholesterol Cal: 14 mg/dL (ref 5–40)

## 2019-06-08 LAB — COMPREHENSIVE METABOLIC PANEL
ALT: 12 IU/L (ref 0–32)
AST: 20 IU/L (ref 0–40)
Albumin/Globulin Ratio: 1.4 (ref 1.2–2.2)
Albumin: 4.4 g/dL (ref 3.8–4.8)
Alkaline Phosphatase: 92 IU/L (ref 39–117)
BUN/Creatinine Ratio: 14 (ref 9–23)
BUN: 12 mg/dL (ref 6–24)
Bilirubin Total: 0.4 mg/dL (ref 0.0–1.2)
CO2: 24 mmol/L (ref 20–29)
Calcium: 9.6 mg/dL (ref 8.7–10.2)
Chloride: 104 mmol/L (ref 96–106)
Creatinine, Ser: 0.88 mg/dL (ref 0.57–1.00)
GFR calc Af Amer: 92 mL/min/{1.73_m2} (ref >59)
GFR calc non Af Amer: 80 mL/min/{1.73_m2} (ref >59)
Globulin, Total: 3.1 g/dL (ref 1.5–4.5)
Glucose: 80 mg/dL (ref 65–99)
Potassium: 4.2 mmol/L (ref 3.5–5.2)
Sodium: 142 mmol/L (ref 134–144)
Total Protein: 7.5 g/dL (ref 6.0–8.5)

## 2019-06-08 LAB — VAGINITIS/VAGINOSIS, DNA PROBE
Candida Species: NEGATIVE
Gardnerella vaginalis: NEGATIVE
Trichomonas vaginosis: NEGATIVE

## 2019-06-08 LAB — VITAMIN D 25 HYDROXY (VIT D DEFICIENCY, FRACTURES): Vit D, 25-Hydroxy: 30.1 ng/mL (ref 30.0–100.0)

## 2019-06-08 LAB — RPR: RPR Ser Ql: NONREACTIVE

## 2019-06-08 LAB — HIV ANTIBODY (ROUTINE TESTING W REFLEX): HIV Screen 4th Generation wRfx: NONREACTIVE

## 2019-06-08 LAB — HEPATITIS C ANTIBODY: Hep C Virus Ab: <0.1 s/co ratio (ref 0.0–0.9)

## 2019-09-07 ENCOUNTER — Other Ambulatory Visit: Payer: Self-pay | Admitting: Surgery

## 2019-09-07 DIAGNOSIS — E041 Nontoxic single thyroid nodule: Secondary | ICD-10-CM

## 2019-09-18 ENCOUNTER — Ambulatory Visit
Admission: RE | Admit: 2019-09-18 | Discharge: 2019-09-18 | Disposition: A | Discharge status: Home / Self Care | Payer: 59 | Source: Ambulatory Visit | Attending: Surgery | Admitting: Surgery

## 2019-09-18 DIAGNOSIS — E041 Nontoxic single thyroid nodule: Secondary | ICD-10-CM

## 2019-12-15 ENCOUNTER — Encounter: Payer: Self-pay | Admitting: Certified Nurse Midwife

## 2020-06-10 ENCOUNTER — Ambulatory Visit: Payer: 59 | Admitting: Certified Nurse Midwife

## 2020-08-02 ENCOUNTER — Ambulatory Visit
Admission: RE | Admit: 2020-08-02 | Discharge: 2020-08-02 | Disposition: A | Discharge status: Home / Self Care | Payer: 59 | Source: Ambulatory Visit

## 2020-08-02 DIAGNOSIS — Z1231 Encounter for screening mammogram for malignant neoplasm of breast: Secondary | ICD-10-CM

## 2020-08-16 NOTE — Progress Notes (Signed)
44 y.o. Christina Hardy Married Christina Hardy or [place] American female here for annual exam.   Period Pattern:  (has IUD no bleeding)   Married Christina Hardy and maybe considering child before 56. Prefers to have screening labs here at this office. Hx: thyroid nodule, biopsy benign 2019 Does not feel any associated symptoms FmHx: Mother breast cancer age: 68, father sudden death from athersclerosis   No LMP recorded. (Menstrual status: IUD).          Sexually active: Yes.    The current method of family planning is IUD. Mirena IUD inserted 10-05-17    Exercising: No.  exercise Smoker:  no  Health Maintenance: Pap:  06-01-18 neg HPV HR neg, 06-07-2019 neg HPV HR neg History of abnormal Pap:  Yes, had colpo but declined to schedule LEEP MMG:  08-05-2020 category b density birads 1:neg Colonoscopy:  none BMD:   none TDaP:  2013 Gardasil:  Not done Covid-19: moderna Pneumonia vaccine(s):  no Shingrix:   no Hep C testing: neg 2020 Screening Labs: done today   reports that she has never smoked. She has never used smokeless tobacco. She reports current alcohol use. She reports that she does not use drugs.  Past Medical History:  Diagnosis Date  . Abnormal Pap smear    yrs ago  . Allergy     Past Surgical History:  Procedure Laterality Date  . BREAST REDUCTION SURGERY  2006  . BREAST SURGERY    . COLPOSCOPY    . INTRAUTERINE DEVICE INSERTION     mirena insertion 2013, 10-05-17 removed & re-inserted  . REDUCTION MAMMAPLASTY Bilateral 2006    Current Outpatient Medications  Medication Sig Dispense Refill  . Multiple Vitamins-Minerals (MULTIVITAMIN PO) Take by mouth daily.     No current facility-administered medications for this visit.    Family History  Problem Relation Age of Onset  . Heart failure Father   . Heart disease Father   . Breast cancer Mother 58  . Hypertension Maternal Grandmother   . Heart disease Paternal Grandmother     Review of Systems  Constitutional: Negative.    HENT: Negative.   Eyes: Negative.   Respiratory: Negative.   Cardiovascular: Negative.   Gastrointestinal: Negative.   Endocrine: Negative.   Genitourinary: Negative.   Musculoskeletal: Negative.   Skin: Negative.   Allergic/Immunologic: Negative.   Neurological: Negative.   Hematological: Negative.   Psychiatric/Behavioral: Negative.   Sometimes has cyclic breast pain  Exam:   BP 114/74   Pulse 68   Resp 16   Ht 5' 7.25" (1.708 m)   Wt 226 lb (102.5 kg)   BMI 35.13 kg/m   Height: 5' 7.25" (170.8 cm)  General appearance: alert, cooperative and appears stated age Head: Normocephalic, without obvious abnormality, atraumatic Neck: no adenopathy, supple, symmetrical, trachea midline and thyroid nodular and asymetrical Lungs: clear to auscultation bilaterally Breasts: normal appearance, no masses or tenderness, surgical scars from reduction Heart: regular rate and rhythm Abdomen: soft, non-tender; bowel sounds normal; no masses,  no organomegaly Extremities: extremities normal, atraumatic, no cyanosis or edema Skin: Skin color, texture, turgor normal. No rashes or lesions Lymph nodes: Cervical, supraclavicular, and axillary nodes normal. No abnormal inguinal nodes palpated Neurologic: Grossly normal   Pelvic: External genitalia:  no lesions              Urethra:  normal appearing urethra with no masses, tenderness or lesions              Bartholins and  Skenes: normal                 Vagina: normal appearing vagina with normal color and discharge, no lesions              Cervix: no cervical motion tenderness, no lesions and Mirena strings visible              Pap taken: No. Bimanual Exam:  Uterus:  normal size, contour, position, consistency, mobility, non-tender              Adnexa: no mass, fullness, tenderness               Rectovaginal: Confirms               Anus:  normal sphincter tone, no lesions  Christina Hardy, CMA Chaperone was present for exam.  A:  Well Woman with  normal exam  Thyroid nodule  IUD Surveillance (placed 2019)  P:   Mammogram, done 07/2020  pap smear due 2023 (recommend co-testing considering history)  Labs:Thyroid panel, CBC,CMP, Lipid panel (non fasting)  Pt still considering pregnancy in next couple years, discussed fertility.  return annually or prn

## 2020-08-19 ENCOUNTER — Other Ambulatory Visit: Payer: Self-pay

## 2020-08-19 ENCOUNTER — Encounter: Payer: Self-pay | Admitting: Nurse Practitioner

## 2020-08-19 ENCOUNTER — Ambulatory Visit: Payer: 59 | Admitting: Nurse Practitioner

## 2020-08-19 VITALS — BP 114/74 | HR 68 | Resp 16 | Ht 67.25 in | Wt 226.0 lb

## 2020-08-19 DIAGNOSIS — Z01419 Encounter for gynecological examination (general) (routine) without abnormal findings: Secondary | ICD-10-CM

## 2020-08-19 DIAGNOSIS — Z30431 Encounter for routine checking of intrauterine contraceptive device: Secondary | ICD-10-CM | POA: Diagnosis not present

## 2020-08-19 DIAGNOSIS — Z8639 Personal history of other endocrine, nutritional and metabolic disease: Secondary | ICD-10-CM

## 2020-08-19 NOTE — Patient Instructions (Addendum)
Nice to meet you today!  Mirena is now FDA approved for 7 years  When you are ready to think about pregnancy, if you need assistance, a good resource is [employer]: Dr. Kerin Perna. [email or web address] [phone]

## 2020-08-20 LAB — COMPREHENSIVE METABOLIC PANEL WITH GFR
ALT: 10 IU/L (ref 0–32)
AST: 11 IU/L (ref 0–40)
Albumin/Globulin Ratio: 1.3 (ref 1.2–2.2)
Albumin: 4 g/dL (ref 3.8–4.8)
Alkaline Phosphatase: 112 IU/L (ref 44–121)
BUN/Creatinine Ratio: 16 (ref 9–23)
BUN: 12 mg/dL (ref 6–24)
Bilirubin Total: 0.3 mg/dL (ref 0.0–1.2)
CO2: 25 mmol/L (ref 20–29)
Calcium: 9.4 mg/dL (ref 8.7–10.2)
Chloride: 105 mmol/L (ref 96–106)
Creatinine, Ser: 0.77 mg/dL (ref 0.57–1.00)
GFR calc Af Amer: 108 mL/min/1.73
GFR calc non Af Amer: 94 mL/min/1.73
Globulin, Total: 3 g/dL (ref 1.5–4.5)
Glucose: 76 mg/dL (ref 65–99)
Potassium: 4.7 mmol/L (ref 3.5–5.2)
Sodium: 143 mmol/L (ref 134–144)
Total Protein: 7 g/dL (ref 6.0–8.5)

## 2020-08-20 LAB — CBC
Hematocrit: 40.7 % (ref 34.0–46.6)
Hemoglobin: 13 g/dL (ref 11.1–15.9)
MCH: 29.1 pg (ref 26.6–33.0)
MCHC: 31.9 g/dL (ref 31.5–35.7)
MCV: 91 fL (ref 79–97)
Platelets: 280 10*3/uL (ref 150–450)
RBC: 4.46 x10E6/uL (ref 3.77–5.28)
RDW: 12.7 % (ref 11.7–15.4)
WBC: 6.4 10*3/uL (ref 3.4–10.8)

## 2020-08-20 LAB — THYROID PANEL WITH TSH
Free Thyroxine Index: 1.4 (ref 1.2–4.9)
T3 Uptake Ratio: 25 % (ref 24–39)
T4, Total: 5.5 ug/dL (ref 4.5–12.0)
TSH: 1.04 u[IU]/mL (ref 0.450–4.500)

## 2020-08-20 LAB — LIPID PANEL WITH LDL/HDL RATIO
Cholesterol, Total: 188 mg/dL (ref 100–199)
HDL: 44 mg/dL (ref >39)
LDL Chol Calc (NIH): 121 mg/dL — ABNORMAL HIGH (ref 0–99)
LDL/HDL Ratio: 2.8 ratio (ref 0.0–3.2)
Triglycerides: 128 mg/dL (ref 0–149)
VLDL Cholesterol Cal: 23 mg/dL (ref 5–40)

## 2021-08-12 ENCOUNTER — Other Ambulatory Visit: Payer: Self-pay | Admitting: Nurse Practitioner

## 2021-08-12 DIAGNOSIS — Z1231 Encounter for screening mammogram for malignant neoplasm of breast: Secondary | ICD-10-CM

## 2021-08-25 ENCOUNTER — Ambulatory Visit: Payer: 59 | Admitting: Obstetrics and Gynecology

## 2021-08-25 ENCOUNTER — Ambulatory Visit: Payer: 59 | Admitting: Nurse Practitioner

## 2021-09-12 ENCOUNTER — Ambulatory Visit
Admission: RE | Admit: 2021-09-12 | Discharge: 2021-09-12 | Disposition: A | Discharge status: Home / Self Care | Payer: 59 | Source: Ambulatory Visit

## 2021-09-12 DIAGNOSIS — Z1231 Encounter for screening mammogram for malignant neoplasm of breast: Secondary | ICD-10-CM

## 2021-09-23 NOTE — Progress Notes (Signed)
45 y.o. G0P0000 Married [place] American female here for annual exam.    She is aware that she may be able to test her fertility.  Married in July, 2021. Not sure if they want to have a family.   Does not have menstrual cycles often with her Mirena. Short episodes of spotting.  Feels hot at times.   Mother dx with precancerous polyps of colon.  Patient is asking about colonoscopy.   Living in [place].   PCP:   none.    No LMP recorded. (Menstrual status: IUD).     Period Pattern:  (occ bleeding with iud)     Sexually active: Yes.    The current method of family planning is mirena iud inserted 10-05-17.    Exercising: No.   exercise Smoker:  no  Health Maintenance: Pap:  06-07-19 neg HPV HR neg, 06-11-18 neg HPV HR neg History of abnormal Pap:  yes, had colpo but declined to schedule leep 2014.   MMG: 09-12-21 category b density birads 1:neg Colonoscopy:  none BMD:   none  Result  n/a TDaP:  2013 Gardasil:   no HIV: neg 2020 Hep C: neg 2020 Screening Labs:  today. Flu vaccine:  will consider at her pharmacy along with bivalent Covid booster.   reports that she has never smoked. She has never used smokeless tobacco. She reports that she does not currently use alcohol. She reports that she does not use drugs.  Past Medical History:  Diagnosis Date   Abnormal Pap smear    yrs ago   Allergy    Thyroid nodule     Past Surgical History:  Procedure Laterality Date   BREAST REDUCTION SURGERY  2006   BREAST SURGERY     COLPOSCOPY     INTRAUTERINE DEVICE INSERTION     mirena insertion 2013, 10-05-17 removed & re-inserted   REDUCTION MAMMAPLASTY Bilateral 2006    No current outpatient medications on file.   No current facility-administered medications for this visit.    Family History  Problem Relation Age of Onset   Heart failure Father    Heart disease Father    Breast cancer Mother 50   Hypertension Maternal Grandmother    Heart disease Paternal Grandmother      Review of Systems  Constitutional: Negative.   HENT: Negative.    Eyes: Negative.   Respiratory: Negative.    Cardiovascular: Negative.   Gastrointestinal: Negative.   Endocrine: Negative.   Genitourinary: Negative.   Musculoskeletal: Negative.   Skin: Negative.   Allergic/Immunologic: Negative.   Neurological: Negative.   Hematological: Negative.   Psychiatric/Behavioral: Negative.     Exam:   BP 116/80    Pulse 82    Resp 16    Ht 5' 7.25" (1.708 m)    Wt 224 lb (101.6 kg)    BMI 34.82 kg/m     General appearance: alert, cooperative and appears stated age Head: normocephalic, without obvious abnormality, atraumatic Neck: no adenopathy, supple, symmetrical, trachea midline and thyroid enlarged and nontender.  Lungs: clear to auscultation bilaterally Breasts: consistent with breast reduction, normal appearance, no masses or tenderness, No nipple retraction or dimpling, No nipple discharge or bleeding, No axillary adenopathy Heart: regular rate and rhythm Abdomen: soft, non-tender; no masses, no organomegaly Extremities: extremities normal, atraumatic, no cyanosis or edema Skin: skin color, texture, turgor normal. No rashes or lesions Lymph nodes: cervical, supraclavicular, and axillary nodes normal. Neurologic: grossly normal  Pelvic: External genitalia:  no lesions  No abnormal inguinal nodes palpated.              Urethra:  normal appearing urethra with no masses, tenderness or lesions              Bartholins and Skenes: normal                 Vagina: normal appearing vagina with normal color and discharge, no lesions              Cervix: no lesions.  IUD strings noted.               Pap taken: no Bimanual Exam:  Uterus:  normal size, contour, position, consistency, mobility, non-tender              Adnexa: no mass, fullness, tenderness              Rectal exam: yes.  Confirms.              Anus:  normal sphincter tone, no lesions  Chaperone was present  for exam:  [name], CMA  Assessment:   Well woman visit with gynecologic exam. Hx cervical dysplasia and no LEEP in 2014.  Follow up paps normal. Mirena IUD.  Hx breast reduction. FH breast cancer in mother. Hx thyroid nodule.  Status post FNA of right thyroid nodule in 2019.  No follow up biopsy or imaging needed on chart review. Colon cancer screening.  Plan: Mammogram screening discussed. Self breast awareness reviewed. Pap and HR HPV in 2023 and every 3 years for 25 years post colposcopy in 2014.  Guidelines for Calcium, Vitamin D, regular exercise program including cardiovascular and weight bearing exercise. Mirena is effective for 8 years.  We discussed AMA status and fertility.   She will let me know if she wishes to pursue fertility care.  Referral for colonoscopy screening.  Routine labs including TFTs. Follow up annually and prn.   After visit summary provided.

## 2021-09-24 ENCOUNTER — Encounter: Payer: Self-pay | Admitting: Obstetrics and Gynecology

## 2021-09-24 ENCOUNTER — Other Ambulatory Visit: Payer: Self-pay

## 2021-09-24 ENCOUNTER — Ambulatory Visit (INDEPENDENT_AMBULATORY_CARE_PROVIDER_SITE_OTHER): Payer: 59 | Admitting: Obstetrics and Gynecology

## 2021-09-24 VITALS — BP 116/80 | HR 82 | Resp 16 | Ht 67.25 in | Wt 224.0 lb

## 2021-09-24 DIAGNOSIS — Z1211 Encounter for screening for malignant neoplasm of colon: Secondary | ICD-10-CM

## 2021-09-24 DIAGNOSIS — Z01419 Encounter for gynecological examination (general) (routine) without abnormal findings: Secondary | ICD-10-CM | POA: Diagnosis not present

## 2021-09-24 NOTE — Patient Instructions (Signed)

## 2021-09-25 LAB — CBC
HCT: 40.2 % (ref 35.0–45.0)
Hemoglobin: 12.8 g/dL (ref 11.7–15.5)
MCH: 29.4 pg (ref 27.0–33.0)
MCHC: 31.8 g/dL — ABNORMAL LOW (ref 32.0–36.0)
MCV: 92.2 fL (ref 80.0–100.0)
MPV: 11.6 fL (ref 7.5–12.5)
Platelets: 232 10*3/uL (ref 140–400)
RBC: 4.36 10*6/uL (ref 3.80–5.10)
RDW: 12.5 % (ref 11.0–15.0)
WBC: 7 10*3/uL (ref 3.8–10.8)

## 2021-09-25 LAB — T4, FREE: Free T4: 1 ng/dL (ref 0.8–1.8)

## 2021-09-25 LAB — LIPID PANEL
Cholesterol: 205 mg/dL — ABNORMAL HIGH (ref <200)
HDL: 52 mg/dL (ref >=50)
LDL Cholesterol (Calc): 128 mg/dL (calc) — ABNORMAL HIGH
Non-HDL Cholesterol (Calc): 153 mg/dL (calc) — ABNORMAL HIGH (ref <130)
Total CHOL/HDL Ratio: 3.9 (calc) (ref <5.0)
Triglycerides: 130 mg/dL (ref <150)

## 2021-09-25 LAB — COMPREHENSIVE METABOLIC PANEL
AG Ratio: 1.4 (calc) (ref 1.0–2.5)
ALT: 12 U/L (ref 6–29)
AST: 15 U/L (ref 10–35)
Albumin: 4.2 g/dL (ref 3.6–5.1)
Alkaline phosphatase (APISO): 94 U/L (ref 31–125)
BUN: 13 mg/dL (ref 7–25)
CO2: 25 mmol/L (ref 20–32)
Calcium: 9.6 mg/dL (ref 8.6–10.2)
Chloride: 106 mmol/L (ref 98–110)
Creat: 0.84 mg/dL (ref 0.50–0.99)
Globulin: 2.9 g/dL (calc) (ref 1.9–3.7)
Glucose, Bld: 65 mg/dL (ref 65–99)
Potassium: 5 mmol/L (ref 3.5–5.3)
Sodium: 143 mmol/L (ref 135–146)
Total Bilirubin: 0.2 mg/dL (ref 0.2–1.2)
Total Protein: 7.1 g/dL (ref 6.1–8.1)

## 2021-09-25 LAB — TSH: TSH: 1.47 mIU/L

## 2021-09-25 LAB — SPECIMEN COMPROMISED

## 2021-11-03 ENCOUNTER — Telehealth: Payer: Self-pay | Admitting: *Deleted

## 2021-11-03 NOTE — Telephone Encounter (Signed)
Patient called states she never heard back regarding GI referral. Referral was placed at [facility] in 08/2021. I called patient and left detailed message per drp access. Number provided to schedule [phone] I explained on voicemail that office stays very behind in referrals.

## 2021-11-04 ENCOUNTER — Encounter: Payer: Self-pay | Admitting: Gastroenterology

## 2021-11-04 NOTE — Telephone Encounter (Signed)
Patient scheduled on 11/28/21 for colonoscopy at [facility].

## 2021-11-11 ENCOUNTER — Other Ambulatory Visit: Payer: Self-pay

## 2021-11-11 ENCOUNTER — Ambulatory Visit (AMBULATORY_SURGERY_CENTER): Payer: Self-pay

## 2021-11-11 VITALS — Ht 67.0 in | Wt 227.0 lb

## 2021-11-11 DIAGNOSIS — Z1211 Encounter for screening for malignant neoplasm of colon: Secondary | ICD-10-CM

## 2021-11-11 MED ORDER — NA SULFATE-K SULFATE-MG SULF 17.5-3.13-1.6 GM/177ML PO SOLN: 1.0000 | Freq: Once | ORAL | 0 refills | Status: AC

## 2021-11-11 NOTE — Progress Notes (Signed)
Denies allergies to eggs or soy products. Denies complication of anesthesia or sedation. Denies use of weight loss medication. Denies use of O2.   Emmi instructions given for colonoscopy.  

## 2021-11-25 ENCOUNTER — Encounter: Payer: Self-pay | Admitting: Gastroenterology

## 2021-11-28 ENCOUNTER — Other Ambulatory Visit: Payer: Self-pay

## 2021-11-28 ENCOUNTER — Encounter: Payer: Self-pay | Admitting: Gastroenterology

## 2021-11-28 ENCOUNTER — Ambulatory Visit (AMBULATORY_SURGERY_CENTER): Payer: 59 | Admitting: Gastroenterology

## 2021-11-28 VITALS — BP 142/88 | HR 70 | Temp 96.6°F | Resp 20 | Ht 67.0 in | Wt 227.0 lb

## 2021-11-28 DIAGNOSIS — D125 Benign neoplasm of sigmoid colon: Secondary | ICD-10-CM

## 2021-11-28 DIAGNOSIS — K635 Polyp of colon: Secondary | ICD-10-CM | POA: Diagnosis not present

## 2021-11-28 DIAGNOSIS — Z1211 Encounter for screening for malignant neoplasm of colon: Secondary | ICD-10-CM

## 2021-11-28 MED ORDER — SODIUM CHLORIDE 0.9 % IV SOLN: 500.0000 mL | Freq: Once | INTRAVENOUS | Status: DC

## 2021-11-28 NOTE — Progress Notes (Signed)
To Pacu, VSS. Report to [email or web address] 

## 2021-11-28 NOTE — Progress Notes (Signed)
VS  DT ? ?Pt's states no medical or surgical changes since previsit or office visit. ? ?

## 2021-11-28 NOTE — Progress Notes (Signed)
[facility] History and Physical ? ? ?Primary Care Physician:  Karma Ganja, NP ? ? ?Reason for Procedure:  Colorectal cancer screening ? ?Plan:    Screening colonoscopy with possible interventions as needed ? ? ? ? ?HPI: Anaia Frith is a very pleasant 46 y.o. female here for screening colonoscopy. ?Denies any nausea, vomiting, abdominal pain, melena or bright red blood per rectum ? ?The risks and benefits as well as alternatives of endoscopic procedure(s) have been discussed and reviewed. All questions answered. The patient agrees to proceed. ? ? ? ?Past Medical History:  ?Diagnosis Date  ? Abnormal Pap smear   ? yrs ago  ? Allergy   ? GERD (gastroesophageal reflux disease)   ? Thyroid nodule   ? ? ?Past Surgical History:  ?Procedure Laterality Date  ? BREAST REDUCTION SURGERY  2006  ? BREAST SURGERY    ? COLPOSCOPY    ? INTRAUTERINE DEVICE INSERTION    ? mirena insertion 2013, 10-05-17 removed & re-inserted  ? REDUCTION MAMMAPLASTY Bilateral 2006  ? ? ?Prior to Admission medications   ?Not on File  ? ? ?No current outpatient medications on file.  ? ?Current Facility-Administered Medications  ?Medication Dose Route Frequency Provider Last Rate Last Admin  ? 0.9 %  sodium chloride infusion  500 mL Intravenous Once Morena Mckissack, Venia Minks, MD      ? ? ?Allergies as of 11/28/2021 - Review Complete 11/28/2021  ?Allergen Reaction Noted  ? Latex Hives 10/13/2012  ? Monistat [miconazole]  09/27/2013  ? Prilosec [omeprazole] Hives 10/13/2012  ? ? ?Family History  ?Problem Relation Age of Onset  ? Colon polyps Mother   ? Breast cancer Mother 84  ? Heart failure Father   ? Heart disease Father   ? Hypertension Maternal Grandmother   ? Heart disease Paternal Grandmother   ? Colon cancer Neg Hx   ? Esophageal cancer Neg Hx   ? Rectal cancer Neg Hx   ? Stomach cancer Neg Hx   ? ? ?Social History  ? ?Socioeconomic History  ? Marital status: Married  ?  Spouse name: Not on file  ? Number of children: Not on file  ? Years  of education: Not on file  ? Highest education level: Not on file  ?Occupational History  ? Not on file  ?Tobacco Use  ? Smoking status: Never  ? Smokeless tobacco: Never  ?Substance and Sexual Activity  ? Alcohol use: Not Currently  ? Drug use: No  ? Sexual activity: Yes  ?  Partners: Male  ?  Birth control/protection: I.U.D.  ?  Comment: mirena inserted 10-05-17  ?Other Topics Concern  ? Not on file  ?Social History Narrative  ? Not on file  ? ?Social Determinants of Health  ? ?Financial Resource Strain: Not on file  ?Food Insecurity: Not on file  ?Transportation Needs: Not on file  ?Physical Activity: Not on file  ?Stress: Not on file  ?Social Connections: Not on file  ?Intimate Partner Violence: Not on file  ? ? ?Review of Systems: ? ?All other review of systems negative except as mentioned in the HPI. ? ?Physical Exam: ?Vital signs in last 24 hours: ?BP 133/81   Pulse 68   Temp (!) 96.6 ?F (35.9 ?C)   Ht 5\' 7"  (1.702 m)   Wt 227 lb (103 kg)   SpO2 98%   BMI 35.55 kg/m?  ?General:   Alert, NAD ?Lungs:  Clear .   ?Heart:  Regular rate and rhythm ?Abdomen:  Soft, nontender and nondistended. ?Neuro/Psych:  Alert and cooperative. Normal mood and affect. A and O x 3 ? ?Reviewed labs, radiology imaging, old records and pertinent past GI work up ? ?Patient is appropriate for planned procedure(s) and anesthesia in an ambulatory setting ? ? ?K. Denzil Magnuson , MD ?[phone]  ? ? ?  ?

## 2021-11-28 NOTE — Progress Notes (Signed)
Called to room to assist during endoscopic procedure.  Patient ID and intended procedure confirmed with present staff. Received instructions for my participation in the procedure from the performing physician.  

## 2021-11-28 NOTE — Patient Instructions (Signed)
Handout on polyps given. ° °YOU HAD AN ENDOSCOPIC PROCEDURE TODAY AT THE [facility]:   Refer to the procedure report that was given to you for any specific questions about what was found during the examination.  If the procedure report does not answer your questions, please call your gastroenterologist to clarify.  If you requested that your care partner not be given the details of your procedure findings, then the procedure report has been included in a sealed envelope for you to review at your convenience later. ° °YOU SHOULD EXPECT: Some feelings of bloating in the abdomen. Passage of more gas than usual.  Walking can help get rid of the air that was put into your GI tract during the procedure and reduce the bloating. If you had a lower endoscopy (such as a colonoscopy or flexible sigmoidoscopy) you may notice spotting of blood in your stool or on the toilet paper. If you underwent a bowel prep for your procedure, you may not have a normal bowel movement for a few days. ° °Please Note:  You might notice some irritation and congestion in your nose or some drainage.  This is from the oxygen used during your procedure.  There is no need for concern and it should clear up in a day or so. ° °SYMPTOMS TO REPORT IMMEDIATELY: ° °Following lower endoscopy (colonoscopy or flexible sigmoidoscopy): ° Excessive amounts of blood in the stool ° Significant tenderness or worsening of abdominal pains ° Swelling of the abdomen that is new, acute ° Fever of 100°F or higher ° °For urgent or emergent issues, a gastroenterologist can be reached at any hour by calling [phone]. °Do not use MyChart messaging for urgent concerns.  ° ° °DIET:  We do recommend a small meal at first, but then you may proceed to your regular diet.  Drink plenty of fluids but you should avoid alcoholic beverages for 24 hours. ° °ACTIVITY:  You should plan to take it easy for the rest of today and you should NOT DRIVE or use heavy machinery  until tomorrow (because of the sedation medicines used during the test).   ° °FOLLOW UP: °Our staff will call the number listed on your records 48-72 hours following your procedure to check on you and address any questions or concerns that you may have regarding the information given to you following your procedure. If we do not reach you, we will leave a message.  We will attempt to reach you two times.  During this call, we will ask if you have developed any symptoms of COVID 19. If you develop any symptoms (ie: fever, flu-like symptoms, shortness of breath, cough etc.) before then, please call [phone].  If you test positive for Covid 19 in the 2 weeks post procedure, please call and report this information to us.   ° °If any biopsies were taken you will be contacted by phone or by letter within the next 1-3 weeks.  Please call us at [phone] if you have not heard about the biopsies in 3 weeks.  ° ° °SIGNATURES/CONFIDENTIALITY: °You and/or your care partner have signed paperwork which will be entered into your electronic medical record.  These signatures attest to the fact that that the information above on your After Visit Summary has been reviewed and is understood.  Full responsibility of the confidentiality of this discharge information lies with you and/or your care-partner.  °

## 2021-11-28 NOTE — Op Note (Signed)
[place] ?Patient Name: Christina Hardy ?Procedure Date: 11/28/2021 2:54 PM ?MRN: [id number] ?Endoscopist: Mauri Pole , MD ?Age: 46 ?Referring MD:  ?Date of Birth: Jul 16, 1976 ?Gender: Female ?Account #: [id number] ?Procedure:                Colonoscopy ?Indications:              Screening for colorectal malignant neoplasm ?Medicines:                Monitored Anesthesia Care ?Procedure:                Pre-Anesthesia Assessment: ?                          - Prior to the procedure, a History and Physical  ?                          was performed, and patient medications and  ?                          allergies were reviewed. The patient's tolerance of  ?                          previous anesthesia was also reviewed. The risks  ?                          and benefits of the procedure and the sedation  ?                          options and risks were discussed with the patient.  ?                          All questions were answered, and informed consent  ?                          was obtained. Prior Anticoagulants: The patient has  ?                          taken no previous anticoagulant or antiplatelet  ?                          agents. ASA Grade Assessment: II - A patient with  ?                          mild systemic disease. After reviewing the risks  ?                          and benefits, the patient was deemed in  ?                          satisfactory condition to undergo the procedure. ?                          After obtaining informed consent, the colonoscope  ?  was passed under direct vision. Throughout the  ?                          procedure, the patient's blood pressure, pulse, and  ?                          oxygen saturations were monitored continuously. The  ?                          Olympus PCF-H190DL (#[id number]) Colonoscope was  ?                          introduced through the anus and advanced to the the  ?                          cecum,  identified by appendiceal orifice and  ?                          ileocecal valve. The colonoscopy was performed  ?                          without difficulty. The patient tolerated the  ?                          procedure well. The quality of the bowel  ?                          preparation was excellent. The ileocecal valve,  ?                          appendiceal orifice, and rectum were photographed. ?Scope In: 3:04:04 PM ?Scope Out: 3:18:14 PM ?Scope Withdrawal Time: 0 hours 11 minutes 37 seconds  ?Total Procedure Duration: 0 hours 14 minutes 10 seconds  ?Findings:                 The perianal and digital rectal examinations were  ?                          normal. ?                          Four sessile polyps were found in the sigmoid  ?                          colon. The polyps were 4 to 7 mm in size. These  ?                          polyps were removed with a cold snare. Resection  ?                          and retrieval were complete. ?                          Non-bleeding external and internal hemorrhoids were  ?  found during retroflexion. The hemorrhoids were  ?                          small. ?Complications:            No immediate complications. ?Estimated Blood Loss:     Estimated blood loss was minimal. ?Impression:               - Four 4 to 7 mm polyps in the sigmoid colon,  ?                          removed with a cold snare. Resected and retrieved. ?                          - Non-bleeding external and internal hemorrhoids. ?Recommendation:           - Patient has a contact number available for  ?                          emergencies. The signs and symptoms of potential  ?                          delayed complications were discussed with the  ?                          patient. Return to normal activities tomorrow.  ?                          Written discharge instructions were provided to the  ?                          patient. ?                          -  Resume previous diet. ?                          - Continue present medications. ?                          - Await pathology results. ?                          - Repeat colonoscopy in 3-10 years for surveillance  ?                          based on pathology results. ?Mauri Pole, MD ?11/28/2021 3:26:07 PM ?This report has been signed electronically. ?

## 2021-12-02 ENCOUNTER — Telehealth: Payer: Self-pay | Admitting: *Deleted

## 2021-12-02 ENCOUNTER — Telehealth: Payer: Self-pay

## 2021-12-02 NOTE — Telephone Encounter (Signed)
Left message on f/u call 

## 2021-12-02 NOTE — Telephone Encounter (Signed)
Left message on follow up call. 

## 2021-12-02 NOTE — Telephone Encounter (Signed)
Called the patient back she had questions regarding time frame for bowel movements post procedure and the presence of Hemorrhoids on her report. Reassured her it was normal to go without a BM for several days post procedure and that it wasn't unusual to have hemorrhoids found after the bowel prep, She has no previous issues with them.   ?

## 2021-12-02 NOTE — Telephone Encounter (Signed)
Patient returned your call stated that she has a couple of questions, requesting a call back. Please advise  ?

## 2021-12-04 ENCOUNTER — Encounter: Payer: Self-pay | Admitting: Gastroenterology

## 2022-04-12 IMAGING — MG MM DIGITAL SCREENING BILAT W/ TOMO AND CAD
8 series · 8 of 24 positions shown · non-contrast
Comparison: Previous exam(s).

CLINICAL DATA: Screening.

EXAM:
DIGITAL SCREENING BILATERAL MAMMOGRAM WITH TOMOSYNTHESIS AND CAD
TECHNIQUE: Bilateral screening digital craniocaudal and mediolateral oblique
mammograms were obtained. Bilateral screening digital breast
tomosynthesis was performed. The images were evaluated with
computer-aided detection.

[L MLO synth-2D]
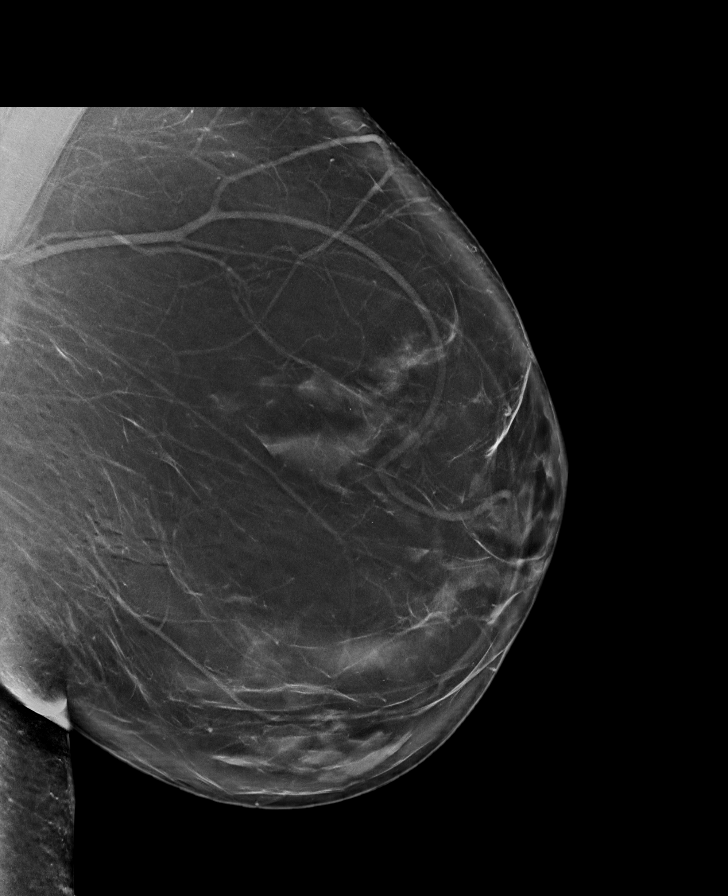

[R CC synth-2D]
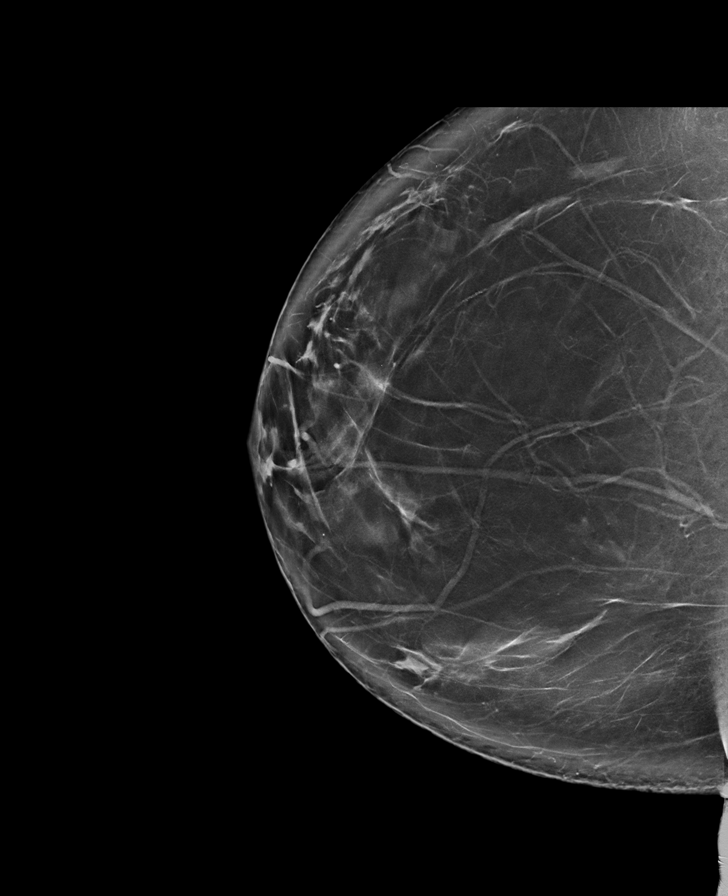

[L CC synth-2D]
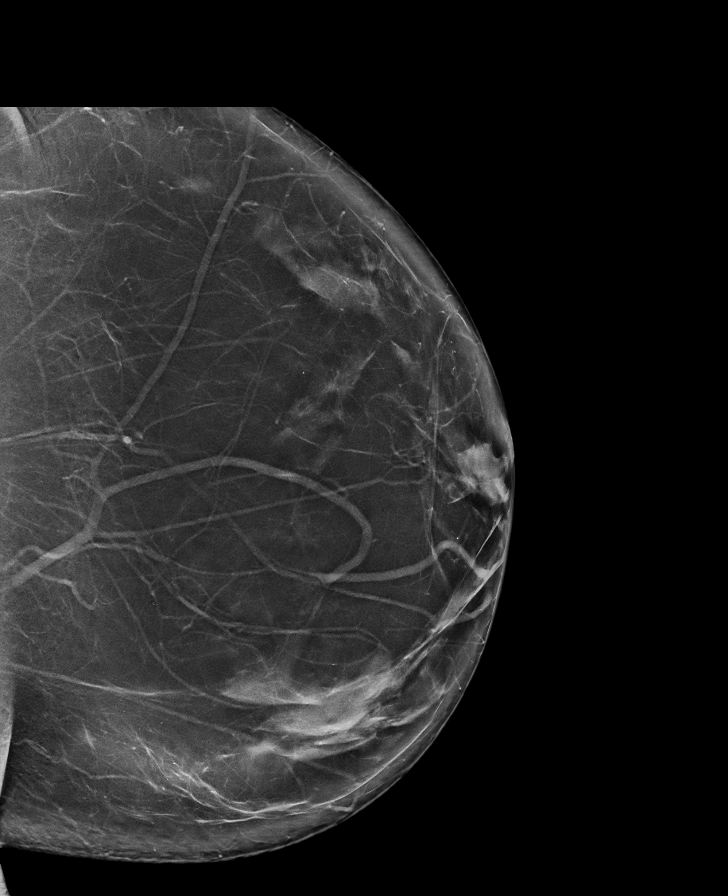

[R MLO synth-2D]
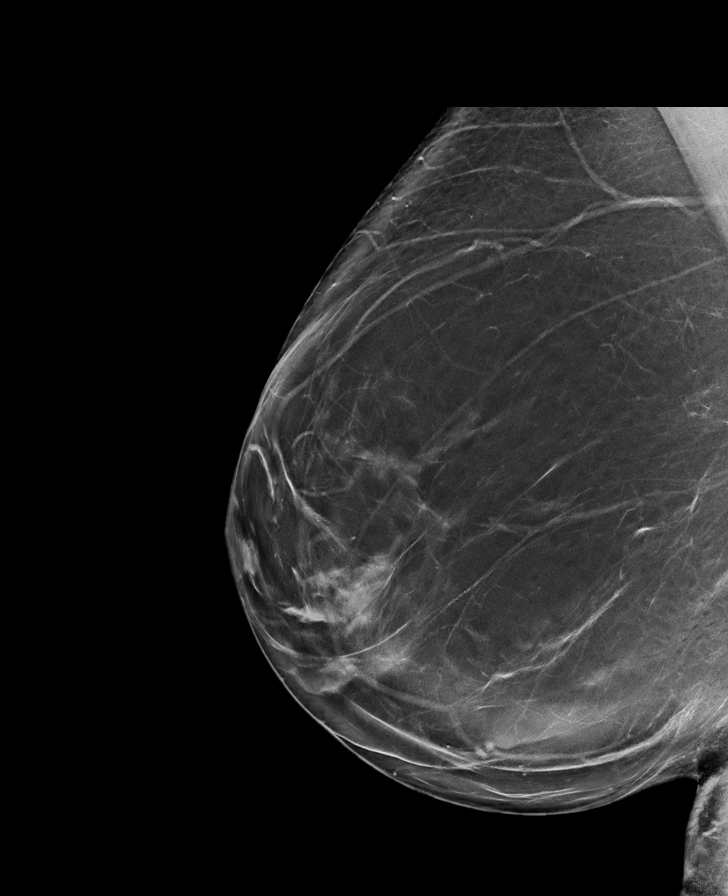

[R CC tomo · tomo slice 47/94.0]
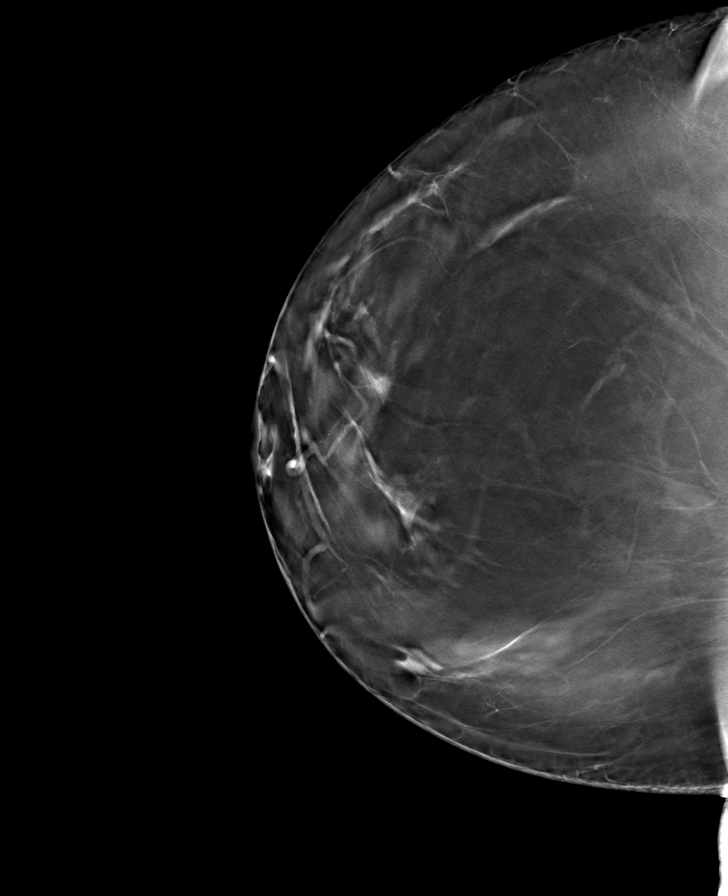

[L MLO tomo · tomo slice 52/103.0]
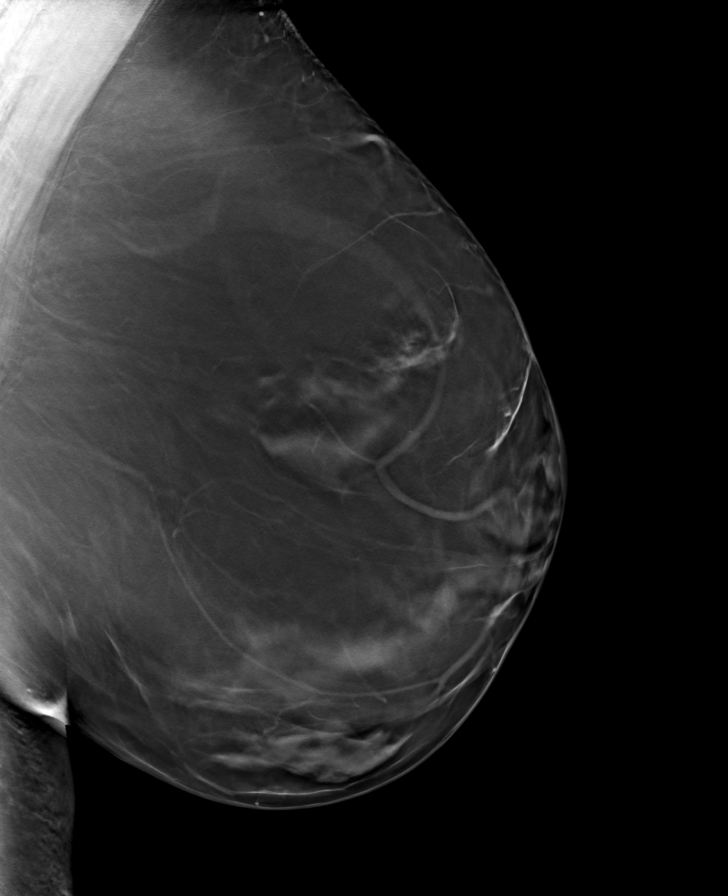

[R MLO tomo · tomo slice 51/100.0]
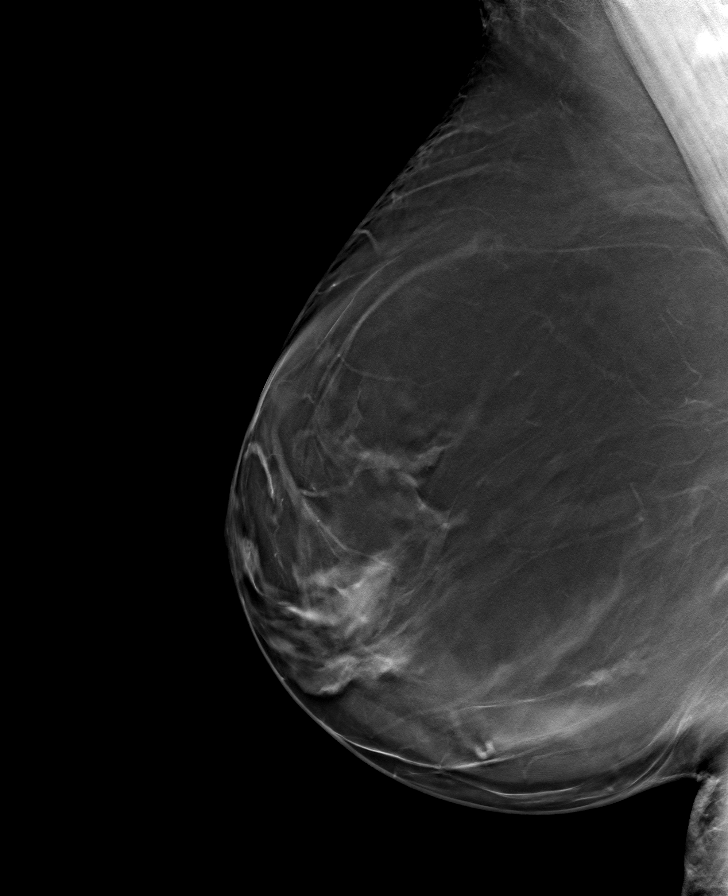

[L CC tomo · tomo slice 47/92.0]
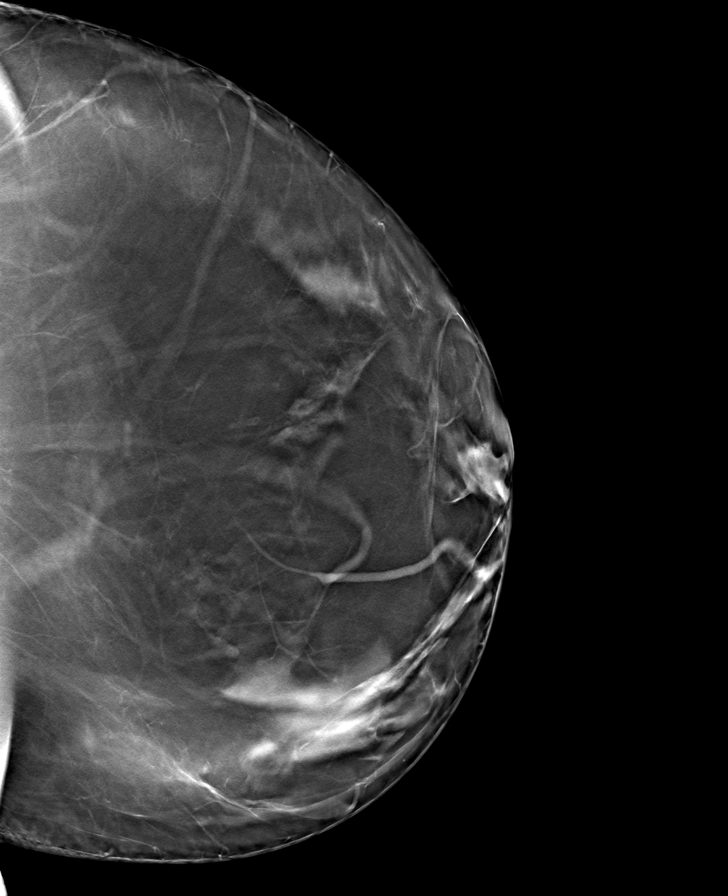

[8 of 24 positions shown; findings below may reference images not displayed]

ACR Breast Density Category b: There are scattered areas of
fibroglandular density.
FINDINGS: There are no findings suspicious for malignancy.
IMPRESSION: No mammographic evidence of malignancy. A result letter of this
screening mammogram will be mailed directly to the patient.

RECOMMENDATION:
Screening mammogram in one year. (Code:51-O-LD2)

BI-RADS CATEGORY  1: Negative.

## 2022-10-02 ENCOUNTER — Other Ambulatory Visit: Payer: Self-pay | Admitting: Obstetrics and Gynecology

## 2022-10-02 DIAGNOSIS — Z1231 Encounter for screening mammogram for malignant neoplasm of breast: Secondary | ICD-10-CM

## 2022-10-19 NOTE — Progress Notes (Signed)
47 y.o. G0P0000 Married [place] American female here for annual exam.  Recently lost mother to bile duct cancer.  She is considering removing the IUD. May want to consider pregnancy.  PCP:  None   No LMP recorded. (Menstrual status: IUD).    Has some spotting about every year with the IUD.       Sexually active: Yes.    The current method of family planning is IUD--Mirena 10/15/17.    Exercising: No.   Pt started walking more often but not regularly. Smoker:  no  Health Maintenance: Pap:  06/07/19 neg: HR HPV neg, 06/01/18 neg: HR HPV neg History of abnormal Pap:  yes, had colpo but declined to schedule leep 2014.     MMG:  09/12/21 Breast Density Category B, BI-RADS CATEGORY 1 Neg.  Scheduled for 11/20/22.  Colonoscopy:  11/28/21 - polyps.   BMD:   n/a  Result  n/a TDaP:  08/11/12 Gardasil:   no HIV: 2020 neg Hep C: 2020 neg Screening Labs:  today.    reports that she has never smoked. She has never used smokeless tobacco. She reports that she does not currently use alcohol. She reports that she does not use drugs.  Past Medical History:  Diagnosis Date   Abnormal Pap smear    yrs ago   Allergy    GERD (gastroesophageal reflux disease)    Thyroid nodule     Past Surgical History:  Procedure Laterality Date   BREAST REDUCTION SURGERY  2006   BREAST SURGERY     COLPOSCOPY     INTRAUTERINE DEVICE INSERTION     mirena insertion 2013, 10-05-17 removed & re-inserted   REDUCTION MAMMAPLASTY Bilateral 2006    No current outpatient medications on file.   No current facility-administered medications for this visit.    Family History  Problem Relation Age of Onset   Stroke Mother    Cancer Mother        bile duct cancer   Colon polyps Mother    Breast cancer Mother 34   Heart failure Father    Heart disease Father    Hypertension Maternal Grandmother    Heart disease Paternal Grandmother    Colon cancer Neg Hx    Esophageal cancer Neg Hx    Rectal cancer Neg Hx     Stomach cancer Neg Hx     Review of Systems  All other systems reviewed and are negative.   Exam:   BP 126/84 (BP Location: Right Arm, Patient Position: Sitting, Cuff Size: Large)   Pulse 71   Ht 5' 7.5" (1.715 m)   Wt 224 lb (101.6 kg)   SpO2 96%   BMI 34.57 kg/m     General appearance: alert, cooperative and appears stated age Head: normocephalic, without obvious abnormality, atraumatic Neck: no adenopathy, supple, symmetrical, trachea midline and thyroid with right sided enlargement (old change) Lungs: clear to auscultation bilaterally Breasts: consistent with bilateral reduction, no masses or tenderness, No nipple retraction or dimpling, No nipple discharge or bleeding, No axillary adenopathy Heart: regular rate and rhythm Abdomen: soft, non-tender; no masses, no organomegaly Extremities: extremities normal, atraumatic, no cyanosis or edema Skin: skin color, texture, turgor normal. No rashes or lesions Lymph nodes: cervical, supraclavicular, and axillary nodes normal. Neurologic: grossly normal  Pelvic: External genitalia:  no lesions              No abnormal inguinal nodes palpated.  Urethra:  normal appearing urethra with no masses, tenderness or lesions              Bartholins and Skenes: normal                 Vagina: normal appearing vagina with normal color and discharge, no lesions              Cervix: no lesions.  IUD string noted.              Pap taken: no Bimanual Exam:  Uterus:  normal size, contour, position, consistency, mobility, non-tender              Adnexa: no mass, fullness, tenderness              Rectal exam: yes.  Confirms.              Anus:  normal sphincter tone, no lesions  Chaperone was present for exam:  Raquel Sarna.  Assessment:   Well woman visit with gynecologic exam. Hx cervical dysplasia and no LEEP in 2014.  Follow up paps normal. Mirena IUD. Due for removal in 2027.  Hx breast reduction. FH breast cancer in mother. Hx  thyroid nodule.  Status post FNA of right thyroid nodule in 2019.  No follow up biopsy or imaging needed on chart review. Considering pregnancy.   Bereavement.   Plan: Mammogram screening discussed. Self breast awareness reviewed. Pap and HR HPV as above. Guidelines for Calcium, Vitamin D, regular exercise program including cardiovascular and weight bearing exercise. Flu and TDap vaccines. AMA status discussed and increased risks of Down's Syndrome, HTN, DM, pregnancy loss.  Start PNV.  Reading recommended:  What to Expect Before Expecting.  Routine labs and Rubella screen.  Preconception counseling performed.  Avoid exposures: tobacco, ETOH, drugs, changing litter box.  Support given for loss of her mother.  I recommended Hospice counseling.  I suggested she meet with her GI to discuss bile cancer.  She will establish care with a PCP.  QR reader for [facility] given to patient.  Follow up annually and prn.   After visit summary provided.   10 min  total time was spent for this patient encounter, including preparation, face-to-face counseling with the patient, coordination of care, and documentation of the encounter for preconception counseling in addition to routine wellness visit.

## 2022-11-02 ENCOUNTER — Ambulatory Visit (INDEPENDENT_AMBULATORY_CARE_PROVIDER_SITE_OTHER): Payer: 59 | Admitting: Obstetrics and Gynecology

## 2022-11-02 ENCOUNTER — Encounter: Payer: Self-pay | Admitting: Obstetrics and Gynecology

## 2022-11-02 VITALS — BP 126/84 | HR 71 | Ht 67.5 in | Wt 224.0 lb

## 2022-11-02 DIAGNOSIS — Z01419 Encounter for gynecological examination (general) (routine) without abnormal findings: Secondary | ICD-10-CM

## 2022-11-02 DIAGNOSIS — Z3169 Encounter for other general counseling and advice on procreation: Secondary | ICD-10-CM

## 2022-11-02 DIAGNOSIS — Z0000 Encounter for general adult medical examination without abnormal findings: Secondary | ICD-10-CM

## 2022-11-02 DIAGNOSIS — Z23 Encounter for immunization: Secondary | ICD-10-CM

## 2022-11-02 NOTE — Patient Instructions (Signed)

## 2022-11-03 LAB — COMPREHENSIVE METABOLIC PANEL
AG Ratio: 1.2 (calc) (ref 1.0–2.5)
ALT: 13 U/L (ref 6–29)
AST: 17 U/L (ref 10–35)
Albumin: 4.1 g/dL (ref 3.6–5.1)
Alkaline phosphatase (APISO): 109 U/L (ref 31–125)
BUN: 11 mg/dL (ref 7–25)
CO2: 28 mmol/L (ref 20–32)
Calcium: 9.5 mg/dL (ref 8.6–10.2)
Chloride: 105 mmol/L (ref 98–110)
Creat: 0.83 mg/dL (ref 0.50–0.99)
Globulin: 3.4 g/dL (calc) (ref 1.9–3.7)
Glucose, Bld: 77 mg/dL (ref 65–99)
Potassium: 4.1 mmol/L (ref 3.5–5.3)
Sodium: 141 mmol/L (ref 135–146)
Total Bilirubin: 0.5 mg/dL (ref 0.2–1.2)
Total Protein: 7.5 g/dL (ref 6.1–8.1)

## 2022-11-03 LAB — CBC
HCT: 41.9 % (ref 35.0–45.0)
Hemoglobin: 13.5 g/dL (ref 11.7–15.5)
MCH: 28.8 pg (ref 27.0–33.0)
MCHC: 32.2 g/dL (ref 32.0–36.0)
MCV: 89.5 fL (ref 80.0–100.0)
MPV: 10.8 fL (ref 7.5–12.5)
Platelets: 262 10*3/uL (ref 140–400)
RBC: 4.68 10*6/uL (ref 3.80–5.10)
RDW: 12.8 % (ref 11.0–15.0)
WBC: 6 10*3/uL (ref 3.8–10.8)

## 2022-11-03 LAB — LIPID PANEL
Cholesterol: 198 mg/dL (ref <200)
HDL: 53 mg/dL (ref >=50)
LDL Cholesterol (Calc): 126 mg/dL (calc) — ABNORMAL HIGH
Non-HDL Cholesterol (Calc): 145 mg/dL (calc) — ABNORMAL HIGH (ref <130)
Total CHOL/HDL Ratio: 3.7 (calc) (ref <5.0)
Triglycerides: 91 mg/dL (ref <150)

## 2022-11-03 LAB — TSH: TSH: 1.12 mIU/L

## 2022-11-03 LAB — RUBELLA SCREEN: Rubella: 5.07 Index

## 2022-11-20 ENCOUNTER — Ambulatory Visit
Admission: RE | Admit: 2022-11-20 | Discharge: 2022-11-20 | Disposition: A | Discharge status: Home / Self Care | Payer: 59 | Source: Ambulatory Visit

## 2022-11-20 ENCOUNTER — Ambulatory Visit: Payer: 59 | Admitting: Family Medicine

## 2022-11-20 ENCOUNTER — Encounter: Payer: Self-pay | Admitting: Family Medicine

## 2022-11-20 ENCOUNTER — Ambulatory Visit (INDEPENDENT_AMBULATORY_CARE_PROVIDER_SITE_OTHER)
Admission: RE | Admit: 2022-11-20 | Discharge: 2022-11-20 | Disposition: A | Discharge status: Home / Self Care | Payer: 59 | Source: Ambulatory Visit | Attending: Family Medicine | Admitting: Family Medicine

## 2022-11-20 VITALS — BP 122/90 | HR 66 | Temp 97.5°F | Ht 67.5 in | Wt 227.0 lb

## 2022-11-20 DIAGNOSIS — E041 Nontoxic single thyroid nodule: Secondary | ICD-10-CM | POA: Diagnosis not present

## 2022-11-20 DIAGNOSIS — R051 Acute cough: Secondary | ICD-10-CM

## 2022-11-20 DIAGNOSIS — R030 Elevated blood-pressure reading, without diagnosis of hypertension: Secondary | ICD-10-CM | POA: Diagnosis not present

## 2022-11-20 DIAGNOSIS — R1011 Right upper quadrant pain: Secondary | ICD-10-CM

## 2022-11-20 DIAGNOSIS — Z1231 Encounter for screening mammogram for malignant neoplasm of breast: Secondary | ICD-10-CM

## 2022-11-20 NOTE — Progress Notes (Unsigned)
New Patient Office Visit  Subjective:  Patient ID: Christina Hardy, female    DOB: Dec 20, 1975  Age: 47 y.o. MRN: [id number]  CC:  Chief Complaint  Patient presents with   Establish Care    Need new pcp Feels like she maybe coming down with a cold Fasting      HPI [facility] presents for new pt. Uri 1 wk, lungs-has been out in cold a lot.  When takes deep breath or a lot of talking has to "catch" breath.  Feels like something not right.  No sob at rest.  If takes deep breath, occ has to cough. No sharp pain.  No long car trips. No edema. No h/o asthma. No f/c.  No runny nose Mom dec in Oct d/t bile duct ca.  Sees GI in April.     Past Medical History:  Diagnosis Date   Abnormal Pap smear    yrs ago   Allergy    GERD (gastroesophageal reflux disease)    Thyroid nodule     Past Surgical History:  Procedure Laterality Date   BREAST REDUCTION SURGERY  2006   BREAST SURGERY     COLPOSCOPY     INTRAUTERINE DEVICE INSERTION     mirena insertion 2013, 10-05-17 removed & re-inserted   REDUCTION MAMMAPLASTY Bilateral 2006    Family History  Problem Relation Age of Onset   Hypertension Mother    Hyperlipidemia Mother    Stroke Mother    Cancer Mother        bile duct cancer   Colon polyps Mother    Breast cancer Mother 1   Heart attack Mother    Heart attack Father    Heart failure Father    Heart disease Father    Stroke Maternal Grandmother    Hypertension Maternal Grandmother    Heart disease Paternal Grandmother    Colon cancer Neg Hx    Esophageal cancer Neg Hx    Rectal cancer Neg Hx    Stomach cancer Neg Hx     Social History   Socioeconomic History   Marital status: Married    Spouse name: Not on file   Number of children: 0   Years of education: Not on file   Highest education level: Not on file  Occupational History   Occupation: import specialist  Tobacco Use   Smoking status: Never   Smokeless tobacco: Never  Vaping Use   Vaping Use: Never  used  Substance and Sexual Activity   Alcohol use: Not Currently   Drug use: No   Sexual activity: Yes    Partners: Male    Birth control/protection: I.U.D.    Comment: Mirena-10/15/17  Other Topics Concern   Not on file  Social History Narrative   Not on file   Social Determinants of Health   Financial Resource Strain: Not on file  Food Insecurity: Not on file  Transportation Needs: Not on file  Physical Activity: Not on file  Stress: Not on file  Social Connections: Not on file  Intimate Partner Violence: Not on file    ROS  ROS: Gen: no fever, chills  Skin: no rash, itching ENT: no ear pain, ear drainage, nasal congestion, rhinorrhea, sinus pressure, sore throat.  Thyroid nodules-bx x 2.  At times, feels needs to be careful when swallowing, but no dysphagia. Eyes: no blurry vision, double vision Resp: HPI CV: no CP, palpitations, LE edema, occ flutters in chest for few sec-mom had afib.  Pt monitors w/watch-no symptoms, lasts few seconds. Poss when overeats.mom had cva-then noted afib, then cancer so trying to be more proactive.  GI: no heartburn, n/v/d/c, occ abd pain RUQ-no pattern.  Belches a lot. GU: no dysuria, urgency, frequency, hematuria MSK: no joint pain, myalgias, back pain Neuro: no dizziness,  weakness, vertigo.  HA last weekend-normally doesn't get ha then lung thing.  Psych: no depression, anxiety, insomnia, SI    grieving mom's death  Objective:   Today's Vitals: BP (!) 122/90   Pulse 66   Temp (!) 97.5 F (36.4 C) (Temporal)   Ht 5' 7.5" (1.715 m)   Wt 227 lb (103 kg)   SpO2 97%   BMI 35.03 kg/m   Physical Exam  Gen: WDWN NAD OAAF HEENT: NCAT, conjunctiva not injected, sclera nonicteric TM WNL B, OP moist, no exudates  NECK:  supple,+ 4cm R thryroid nodule, no nodes, no carotid bruits CARDIAC: RRR, S1S2+, no murmur. DP 2+B LUNGS: CTAB. No wheezes ABDOMEN:  BS+, soft, NTND, No HSM, no masses EXT:  no edema MSK: no gross abnormalities.   NEURO: A&O x3.  CN II-XII intact.  PSYCH: normal mood. Good eye contact   Assessment & Plan:   Problem List Items Addressed This Visit       Endocrine   Thyroid nodule   Other Visit Diagnoses     Acute cough    -  Primary   Relevant Orders   DG Chest 2 View   RUQ pain       Relevant Orders   US ABDOMEN LIMITED RUQ (LIVER/GB)   Elevated blood pressure reading         1.  Cough-question URI, obstruction, other.  Will check chest x-ray.  New symptoms, worse, not improving-will need further workup.  Question if coming from thyroid nodule 2.  Intermittent right upper quadrant pain-reviewed labs from GYN from 11/02/2022-unrevealing.  Keep log of events.  Check ultrasound. 3.  Elevated blood pressure reading-patient has a monitor at home.  Check twice weekly.  Keep a log.  Work on diet/exercise (has not been doing anything).  LDL 126.  Discussed risk reduction.  Follow-up in 2 months  Outpatient Encounter Medications as of 11/20/2022  Medication Sig   ELDERBERRY PO Take 10 mLs by mouth daily.   levonorgestrel (MIRENA) 20 MCG/DAY IUD 1 each by Intrauterine route once.   Multiple Vitamin (MULTIVITAMIN) LIQD Take 20 mLs by mouth daily.   No facility-administered encounter medications on file as of 11/20/2022.    Follow-up: Return in about 2 months (around 01/19/2023) for bp.   Wellington Hampshire, MD

## 2022-11-20 NOTE — Patient Instructions (Signed)
Welcome to [facility]! It was a pleasure meeting you today.  As discussed, Please schedule a 2 month follow up visit today.  Get large blood pressure cuff-check 2x/wk.  Work on [employer] u/s liver  get X-ray/labs at [facility].  [place]  hours 8=M-F 8:30-5.  closed 12:30-1 lunch  [place] [id number] for mammogram, other studies   PLEASE NOTE:  If you had any LAB tests please let us know if you have not heard back within a few days. You may see your results on MyChart before we have a chance to review them but we will give you a call once they are reviewed by [place]. If we ordered any REFERRALS today, please let us know if you have not heard from their office within the next week.  Let us know through MyChart if you are needing REFILLS, or have your pharmacy send [place] the request. You can also use MyChart to communicate with me or any office staff.  Please try these tips to maintain a healthy lifestyle:  Eat most of your calories during the day when you are active. Eliminate processed foods including packaged sweets (pies, cakes, cookies), reduce intake of potatoes, white bread, white pasta, and white rice. Look for whole grain options, oat flour or almond flour.  Each meal should contain half fruits/vegetables, one quarter protein, and one quarter carbs (no bigger than a computer mouse).  Cut down on sweet beverages. This includes juice, soda, and sweet tea. Also watch fruit intake, though this is a healthier sweet option, it still contains natural sugar! Limit to 3 servings daily.  Drink at least 1 glass of water with each meal and aim for at least 8 glasses per day  Exercise at least 150 minutes every week.

## 2022-11-21 ENCOUNTER — Encounter: Payer: Self-pay | Admitting: Family Medicine

## 2022-11-21 DIAGNOSIS — E041 Nontoxic single thyroid nodule: Secondary | ICD-10-CM | POA: Insufficient documentation

## 2022-11-22 ENCOUNTER — Other Ambulatory Visit: Payer: Self-pay | Admitting: Family Medicine

## 2022-11-22 DIAGNOSIS — J189 Pneumonia, unspecified organism: Secondary | ICD-10-CM

## 2022-11-22 MED ORDER — AZITHROMYCIN 250 MG PO TABS: ORAL_TABLET | ORAL | 0 refills | Status: AC

## 2022-11-22 MED ORDER — AMOXICILLIN-POT CLAVULANATE 875-125 MG PO TABS: 1.0000 | ORAL_TABLET | Freq: Two times a day (BID) | ORAL | 0 refills | Status: DC

## 2022-11-22 NOTE — Progress Notes (Signed)
Called patient on 11/22/2022 at 1:50 PM.  Discussed x-ray findings.  Will send in Augmentin 875 mg twice daily and Z-Pak.  If not improving, let us know.  Repeat chest x-ray in 1 month for resolution.  Questions were answered.

## 2022-11-22 NOTE — Progress Notes (Signed)
See x-ray report

## 2022-12-14 ENCOUNTER — Ambulatory Visit (INDEPENDENT_AMBULATORY_CARE_PROVIDER_SITE_OTHER)
Admission: RE | Admit: 2022-12-14 | Discharge: 2022-12-14 | Disposition: A | Discharge status: Home / Self Care | Payer: 59 | Source: Ambulatory Visit | Attending: Family Medicine | Admitting: Family Medicine

## 2022-12-14 ENCOUNTER — Encounter: Payer: Self-pay | Admitting: Family Medicine

## 2022-12-14 ENCOUNTER — Ambulatory Visit: Payer: 59 | Admitting: Family Medicine

## 2022-12-14 VITALS — BP 120/76 | HR 71 | Temp 97.6°F | Ht 67.5 in | Wt 223.0 lb

## 2022-12-14 DIAGNOSIS — J189 Pneumonia, unspecified organism: Secondary | ICD-10-CM

## 2022-12-14 DIAGNOSIS — R0609 Other forms of dyspnea: Secondary | ICD-10-CM

## 2022-12-14 MED ORDER — DOXYCYCLINE HYCLATE 100 MG PO TABS: 100.0000 mg | ORAL_TABLET | Freq: Two times a day (BID) | ORAL | 0 refills | Status: DC

## 2022-12-14 MED ORDER — ALBUTEROL SULFATE HFA 108 (90 BASE) MCG/ACT IN AERS: 2.0000 | INHALATION_SPRAY | Freq: Four times a day (QID) | RESPIRATORY_TRACT | 0 refills | Status: AC | PRN

## 2022-12-14 NOTE — Progress Notes (Signed)
Subjective:     Patient ID: Christina Hardy, female    DOB: 1975/11/13, 47 y.o.   MRN: [id number]  Chief Complaint  Patient presents with   Cough    Sx not getting any better after finishing antibiotics    Fever    Last Tuesday temp was 101.7   Fatigue    Feeling very tired and SOB at times    HPI Cough-still present despite abx-had repeat cxr today.  Feeling worse as sob quicker than when on abx.  No h/o asthma.non smoker Fever last wk 101.7.  got hot then really cold and bp 149/103.  Then chills again.  Lasted 3 hrs and then fine.   Fatigue-feeling tired and sob at times. - wbc from 2/5-neg but cough started after that.  There are no preventive care reminders to display for this patient.   Past Medical History:  Diagnosis Date   Abnormal Pap smear    yrs ago   Allergy    GERD (gastroesophageal reflux disease)    Thyroid nodule     Past Surgical History:  Procedure Laterality Date   BREAST REDUCTION SURGERY  2006   BREAST SURGERY     COLPOSCOPY     INTRAUTERINE DEVICE INSERTION     mirena insertion 2013, 10-05-17 removed & re-inserted   REDUCTION MAMMAPLASTY Bilateral 2006    Outpatient Medications Prior to Visit  Medication Sig Dispense Refill   ELDERBERRY PO Take 10 mLs by mouth daily.     levonorgestrel (MIRENA) 20 MCG/DAY IUD 1 each by Intrauterine route once.     Multiple Vitamin (MULTIVITAMIN) LIQD Take 20 mLs by mouth daily.     amoxicillin-clavulanate (AUGMENTIN) 875-125 MG tablet Take 1 tablet by mouth 2 (two) times daily. (Patient not taking: Reported on 12/14/2022) 20 tablet 0   No facility-administered medications prior to visit.    Allergies  Allergen Reactions   Latex Hives   Monistat [Miconazole]     Severe itching   Prilosec [Omeprazole] Hives   ROS neg/noncontributory except as noted HPI/below      Objective:     BP 120/76   Pulse 71   Temp 97.6 F (36.4 C) (Temporal)   Ht 5' 7.5" (1.715 m)   Wt 223 lb (101.2 kg)   SpO2 97%   BMI  34.41 kg/m  Wt Readings from Last 3 Encounters:  12/14/22 223 lb (101.2 kg)  11/20/22 227 lb (103 kg)  11/02/22 224 lb (101.6 kg)    Physical Exam   Gen: WDWN NAD HEENT: NCAT, conjunctiva not injected, sclera nonicteric CARDIAC: RRR, S1S2+, no murmur.  LUNGS: CTAB. No wheezes EXT:  no edema MSK: no gross abnormalities.  NEURO: A&O x3.  CN II-XII intact.  PSYCH: normal mood. Good eye contact Disscussed CXR report.     Assessment & Plan:   Problem List Items Addressed This Visit   None Visit Diagnoses     Community acquired pneumonia, unspecified laterality    -  Primary   Relevant Medications   doxycycline (VIBRA-TABS) 100 MG tablet   albuterol (VENTOLIN HFA) 108 (90 Base) MCG/ACT inhaler   Other Relevant Orders   Basic metabolic panel   CBC with Differential/Platelet   HIV Antibody (routine testing w rflx)   CT Chest W Contrast   Dyspnea on exertion       Relevant Orders   Basic metabolic panel   CBC with Differential/Platelet   HIV Antibody (routine testing w rflx)   CT Chest W Contrast  Pneumonia-still symptomatic w/fever last wk and still DOE.  Was on augmentin and zpk.  Will check cbc,cmp,HIV(since unusual).  Check CT chest.  Will do doxy 100bid and albuterol inhaler.  Worse, no change, new symptoms, let us know.  Consider pulm.  Meds ordered this encounter  Medications   doxycycline (VIBRA-TABS) 100 MG tablet    Sig: Take 1 tablet (100 mg total) by mouth 2 (two) times daily.    Dispense:  20 tablet    Refill:  0   albuterol (VENTOLIN HFA) 108 (90 Base) MCG/ACT inhaler    Sig: Inhale 2 puffs into the lungs every 6 (six) hours as needed for wheezing or shortness of breath.    Dispense:  8 g    Refill:  0    Wellington Hampshire, MD

## 2022-12-14 NOTE — Patient Instructions (Signed)
Albuterol inhaler, doxycycline antibiotics  Checking CT chest  Worse, no change, let me know

## 2022-12-15 LAB — CBC WITH DIFFERENTIAL/PLATELET
Basophils Absolute: 0.1 10*3/uL (ref 0.0–0.1)
Basophils Relative: 0.6 % (ref 0.0–3.0)
Eosinophils Absolute: 0.3 10*3/uL (ref 0.0–0.7)
Eosinophils Relative: 3.7 % (ref 0.0–5.0)
HCT: 39.8 % (ref 36.0–46.0)
Hemoglobin: 12.8 g/dL (ref 12.0–15.0)
Lymphocytes Relative: 21.9 % (ref 12.0–46.0)
Lymphs Abs: 1.9 10*3/uL (ref 0.7–4.0)
MCHC: 32.1 g/dL (ref 30.0–36.0)
MCV: 90 fl (ref 78.0–100.0)
Monocytes Absolute: 0.7 10*3/uL (ref 0.1–1.0)
Monocytes Relative: 7.5 % (ref 3.0–12.0)
Neutro Abs: 5.9 10*3/uL (ref 1.4–7.7)
Neutrophils Relative %: 66.3 % (ref 43.0–77.0)
Platelets: 266 10*3/uL (ref 150.0–400.0)
RBC: 4.42 Mil/uL (ref 3.87–5.11)
RDW: 13.7 % (ref 11.5–15.5)
WBC: 8.9 10*3/uL (ref 4.0–10.5)

## 2022-12-15 LAB — BASIC METABOLIC PANEL
BUN: 9 mg/dL (ref 6–23)
CO2: 27 mEq/L (ref 19–32)
Calcium: 9.4 mg/dL (ref 8.4–10.5)
Chloride: 104 mEq/L (ref 96–112)
Creatinine, Ser: 0.77 mg/dL (ref 0.40–1.20)
GFR: 91.4 mL/min (ref >60.00)
Glucose, Bld: 74 mg/dL (ref 70–99)
Potassium: 4.7 mEq/L (ref 3.5–5.1)
Sodium: 141 mEq/L (ref 135–145)

## 2022-12-21 ENCOUNTER — Ambulatory Visit
Admission: RE | Admit: 2022-12-21 | Discharge: 2022-12-21 | Disposition: A | Discharge status: Home / Self Care | Payer: 59 | Source: Ambulatory Visit | Attending: Family Medicine | Admitting: Family Medicine

## 2022-12-21 DIAGNOSIS — R1011 Right upper quadrant pain: Secondary | ICD-10-CM

## 2022-12-28 ENCOUNTER — Ambulatory Visit
Admission: RE | Admit: 2022-12-28 | Discharge: 2022-12-28 | Disposition: A | Discharge status: Home / Self Care | Payer: 59 | Source: Ambulatory Visit | Attending: Family Medicine | Admitting: Family Medicine

## 2022-12-28 ENCOUNTER — Encounter: Payer: Self-pay | Admitting: Family Medicine

## 2022-12-28 DIAGNOSIS — R0609 Other forms of dyspnea: Secondary | ICD-10-CM

## 2022-12-28 DIAGNOSIS — J189 Pneumonia, unspecified organism: Secondary | ICD-10-CM

## 2022-12-28 MED ORDER — IOHEXOL 350 MG/ML SOLN
75.0000 mL | Freq: Once | INTRAVENOUS | Status: AC | PRN
  Administered 2022-12-28: 75 mL via INTRAVENOUS

## 2022-12-30 NOTE — Progress Notes (Signed)
Please schedule appointment to discuss this.  It can be video.  No panic

## 2023-01-01 ENCOUNTER — Encounter: Payer: Self-pay | Admitting: Family Medicine

## 2023-01-01 ENCOUNTER — Telehealth (INDEPENDENT_AMBULATORY_CARE_PROVIDER_SITE_OTHER): Payer: 59 | Admitting: Family Medicine

## 2023-01-01 DIAGNOSIS — J986 Disorders of diaphragm: Secondary | ICD-10-CM

## 2023-01-01 DIAGNOSIS — E041 Nontoxic single thyroid nodule: Secondary | ICD-10-CM | POA: Diagnosis not present

## 2023-01-01 DIAGNOSIS — I7121 Aneurysm of the ascending aorta, without rupture: Secondary | ICD-10-CM | POA: Diagnosis not present

## 2023-01-01 DIAGNOSIS — J189 Pneumonia, unspecified organism: Secondary | ICD-10-CM | POA: Diagnosis not present

## 2023-01-01 NOTE — Progress Notes (Unsigned)
MyChart Video Visit    Virtual Visit via Video Note   This visit type was conducted. This format is felt to be most appropriate for this patient at this time. Physical exam was limited by quality of the video and audio technology used for the visit. CMA was able to get the patient set up on a video visit.  Patient location: Home. Patient and provider in visit Provider location: Office  I discussed the limitations of evaluation and management by telemedicine and the availability of in person appointments. The patient expressed understanding and agreed to proceed.  Visit Date: 01/01/2023  Today's healthcare provider: Angelena Sole, MD     Subjective:    Patient ID: Christina Hardy, female    DOB: 16-Apr-1976, 48 y.o.   MRN: [id number]  Chief Complaint  Patient presents with   Follow-up    Follow-up to discuss recent results and treatment options    HPI  Go over CT report.  Pneumonia better.  Still a little tickle in throat.  Had uncle w/aneursym brain  Past Medical History:  Diagnosis Date   Abnormal Pap smear    yrs ago   Allergy    GERD (gastroesophageal reflux disease)    Thyroid nodule     Past Surgical History:  Procedure Laterality Date   BREAST REDUCTION SURGERY  2006   BREAST SURGERY     COLPOSCOPY     INTRAUTERINE DEVICE INSERTION     mirena insertion 2013, 10-05-17 removed & re-inserted   REDUCTION MAMMAPLASTY Bilateral 2006    Outpatient Medications Prior to Visit  Medication Sig Dispense Refill   albuterol (VENTOLIN HFA) 108 (90 Base) MCG/ACT inhaler Inhale 2 puffs into the lungs every 6 (six) hours as needed for wheezing or shortness of breath. 8 g 0   ELDERBERRY PO Take 10 mLs by mouth daily.     levonorgestrel (MIRENA) 20 MCG/DAY IUD 1 each by Intrauterine route once.     Multiple Vitamin (MULTIVITAMIN) LIQD Take 20 mLs by mouth daily.     doxycycline (VIBRA-TABS) 100 MG tablet Take 1 tablet (100 mg total) by mouth 2 (two) times daily. (Patient not  taking: Reported on 01/01/2023) 20 tablet 0   No facility-administered medications prior to visit.    Allergies  Allergen Reactions   Latex Hives   Monistat [Miconazole]     Severe itching   Prilosec [Omeprazole] Hives        Objective:     Physical Exam  Vitals and nursing note reviewed.  Constitutional:      General:  is not in acute distress.    Appearance: Normal appearance.  HENT:     Head: Normocephalic.  Pulmonary:     Effort: No respiratory distress.  Skin:    General: Skin is dry.     Coloration: Skin is not pale.  Neurological:     Mental Status: Pt is alert and oriented to person, place, and time.  Psychiatric:        Mood and Affect: Mood normal.   There were no vitals taken for this visit.  Wt Readings from Last 3 Encounters:  12/14/22 223 lb (101.2 kg)  11/20/22 227 lb (103 kg)  11/02/22 224 lb (101.6 kg)   Reviewed CT report w/pateint 1. Ascending thoracic aortic aneurysm, 4 cm diameter. 2. 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 Ultrasound correlation recommended. 3. No pneumonia identified. 4. Elevated left hemidiaphragm.    Assessment & Plan:   Problem List  Items Addressed This Visit       Cardiovascular and Mediastinum   Aneurysm of ascending aorta without rupture - Primary     Respiratory   Elevated hemidiaphragm     Endocrine   Thyroid nodule   Other Visit Diagnoses     Community acquired pneumonia, unspecified laterality         1.  Aneurysm of ascending aorta-new finding.  Will do some research and find out if this should be followed by cardiology or CVTS and get back with patient.  (3.5 cm in 2007) 2.  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  Will review other films and call radiology to see if this needs to be followed-rad recommend.  Will discuss with surg 3.  Elevated left hemidiaphragm-will discuss with radiology.  (Present since at least  2007-discussed after the visit w/rad) 4.  Community-acquired pneumonia-clinically doing better.  CT was negative.  Resolved  No orders of the defined types were placed in this encounter.   I discussed the assessment and treatment plan with the patient. The patient was provided an opportunity to ask questions and all were answered. The patient agreed with the plan and demonstrated an understanding of the instructions.   The patient was advised to call back or seek an in-person evaluation if the symptoms worsen or if the condition fails to improve as anticipated.   Christina SoleAnn M Witten Certain, MD [facility] [place] [phone] (phone) [phone] (fax)  [facility]

## 2023-01-03 DIAGNOSIS — I7121 Aneurysm of the ascending aorta, without rupture: Secondary | ICD-10-CM | POA: Insufficient documentation

## 2023-01-03 DIAGNOSIS — J986 Disorders of diaphragm: Secondary | ICD-10-CM | POA: Insufficient documentation

## 2023-01-07 NOTE — Addendum Note (Signed)
Addended by: Angelena Sole on: 01/07/2023 02:58 PM   Modules accepted: Orders

## 2023-01-11 ENCOUNTER — Encounter: Payer: Self-pay | Admitting: Family Medicine

## 2023-01-19 ENCOUNTER — Ambulatory Visit: Payer: 59 | Admitting: Family Medicine

## 2023-01-19 ENCOUNTER — Encounter: Payer: Self-pay | Admitting: Family Medicine

## 2023-01-19 ENCOUNTER — Ambulatory Visit (INDEPENDENT_AMBULATORY_CARE_PROVIDER_SITE_OTHER)
Admission: RE | Admit: 2023-01-19 | Discharge: 2023-01-19 | Disposition: A | Discharge status: Home / Self Care | Payer: 59 | Source: Ambulatory Visit | Attending: Family Medicine | Admitting: Family Medicine

## 2023-01-19 VITALS — BP 130/80 | HR 61 | Temp 97.8°F | Ht 67.5 in | Wt 225.5 lb

## 2023-01-19 DIAGNOSIS — R051 Acute cough: Secondary | ICD-10-CM

## 2023-01-19 DIAGNOSIS — R591 Generalized enlarged lymph nodes: Secondary | ICD-10-CM

## 2023-01-19 DIAGNOSIS — R030 Elevated blood-pressure reading, without diagnosis of hypertension: Secondary | ICD-10-CM | POA: Diagnosis not present

## 2023-01-19 LAB — CBC WITH DIFFERENTIAL/PLATELET
Eosinophils Relative: 2.3 %
HCT: 39.8 % (ref 35.0–45.0)
MCH: 28.9 pg (ref 27.0–33.0)
MCHC: 32.4 g/dL (ref 32.0–36.0)
MCV: 89.2 fL (ref 80.0–100.0)
Platelets: 186 10*3/uL (ref 140–400)
RBC: 4.46 10*6/uL (ref 3.80–5.10)
WBC: 6.9 10*3/uL (ref 3.8–10.8)

## 2023-01-19 NOTE — Progress Notes (Signed)
Subjective:     Patient ID: Christina Hardy, female    DOB: 1976-06-06, 47 y.o.   MRN: [id number]  Chief Complaint  Patient presents with   Medical Management of Chronic Issues    2 month follow-up on HTN Last Thursday, felt like she has pneumonia again, chills, cough, fatigue, SOB    HPI  Elevated blood pressure-has been checking 120's/80's.  Felt badly on 4/18 and felt "horrible"-shortness of breath,chills,.fatigue, headache(s) and blood pressure's elevated 130's/90's.  Improving but still not feeling "great".   No chest pain, no edema.  Occasional cough Chills, cough, fatigue, shortness of breath since 4/18. Using inhaler and ibuprofen. No history of asthma.  +history of allergies.  Today feels more allergies.  Occasional palpation. "EKG" on watch "fine". No heartburn. No fever x 99 on 4/18.  Mirena for 4-5 years.   There are no preventive care reminders to display for this patient.   Past Medical History:  Diagnosis Date   Abnormal Pap smear    yrs ago   Allergy    GERD (gastroesophageal reflux disease)    Thyroid nodule     Past Surgical History:  Procedure Laterality Date   BREAST REDUCTION SURGERY  2006   BREAST SURGERY     COLPOSCOPY     INTRAUTERINE DEVICE INSERTION     mirena insertion 2013, 10-05-17 removed & re-inserted   REDUCTION MAMMAPLASTY Bilateral 2006     Current Outpatient Medications:    albuterol (VENTOLIN HFA) 108 (90 Base) MCG/ACT inhaler, Inhale 2 puffs into the lungs every 6 (six) hours as needed for wheezing or shortness of breath., Disp: 8 g, Rfl: 0   levonorgestrel (MIRENA) 20 MCG/DAY IUD, 1 each by Intrauterine route once., Disp: , Rfl:    Multiple Vitamin (MULTIVITAMIN) LIQD, Take 20 mLs by mouth daily., Disp: , Rfl:    doxycycline (VIBRA-TABS) 100 MG tablet, Take 1 tablet (100 mg total) by mouth 2 (two) times daily. (Patient not taking: Reported on 01/01/2023), Disp: 20 tablet, Rfl: 0   ELDERBERRY PO, Take 10 mLs by mouth daily. (Patient not  taking: Reported on 01/19/2023), Disp: , Rfl:   Allergies  Allergen Reactions   Latex Hives   Monistat [Miconazole]     Severe itching   Prilosec [Omeprazole] Hives   ROS neg/noncontributory except as noted HPI/below      Objective:     BP 130/80   Pulse 61   Temp 97.8 F (36.6 C) (Temporal)   Ht 5' 7.5" (1.715 m)   Wt 225 lb 8 oz (102.3 kg)   SpO2 98%   BMI 34.80 kg/m  Wt Readings from Last 3 Encounters:  01/19/23 225 lb 8 oz (102.3 kg)  12/14/22 223 lb (101.2 kg)  11/20/22 227 lb (103 kg)    Physical Exam   Gen: WDWN NAD  patient cuff right arm-142/99-ours 128/82 HEENT: NCAT, conjunctiva not injected, sclera nonicteric TM WNL B, OP moist, no exudates -larger tonsils NECK:  supple, + right thyromegaly, + approx 1.5cm, mobile, sl tender node right cervical nodes, no carotid bruits.  No axillary nodes CARDIAC: RRR, S1S2+, no murmur. DP 2+B LUNGS: CTAB. No wheezes ABDOMEN:  BS+, soft, NTND, No HSM, no masses EXT:  no edema MSK: no gross abnormalities.  NEURO: A&O x3.  CN II-XII intact.  PSYCH: normal mood. Good eye contact     Assessment & Plan:  Lymphadenopathy of head and neck -     CT SOFT TISSUE NECK W CONTRAST; Future -  CBC with Differential/Platelet -     Comprehensive metabolic panel  Acute cough -     DG Chest 2 View; Future -     CBC with Differential/Platelet -     Comprehensive metabolic panel  Elevated blood pressure reading   Elevated blood pressure-not sure patient cuff working right as 2 different people checked ours here and hers kept reading 140's/92-99.  Get new cuff Cough-?GERD, allergies, pneumonia, upper respiratory infection (URI), other.  Check chest xray, cbcd,CMP Thromegally, lymphadenopathy-?infection, reactive, mass, other.  Check cbcd,CMP, CT neck Contact info given in AVS and printed for card and surgery.   Angelena Sole, MD

## 2023-01-19 NOTE — Progress Notes (Signed)
Christina Hardy    [id number]    1976/04/29  Primary Care Physician:Kulik, Maryruth Hancock, MD  Referring Physician: No referring provider defined for this encounter.   Chief complaint:  Chief Complaint  Patient presents with   Family history of bile duct cancer    Patient states she was referred to discuss her mother's cancer that was found last year. Patient states she was dx with bile duct cancer in 2023/07/27 of last year and died a few weeks later. She was told to discuss this with the GI doc.   Gastroesophageal Reflux    Patient reports sever reflux and indigestion even with water.    HPI: Christina Hardy is a 47 y.o. very female presenting to clinic today for consult for GERD.   She is accompanied by her husband today.   Today, she complains of acid reflux and RUQ pain. She reports typically having 2-3x BM a week. She denies taking any laxatives or fibers but she states she can manage her BM with diet. We also discussed  bile duct cancer diagnosis and reviewed the statistic, contributing factors and her recent scans. She is experiencing worsening heartburn, was previously taking famotidine with no improvement.  She developed hives to Prilosec and stopped.  Complains of right upper quadrant discomfort radiating to the epigastric area.  Denies diarrhea, constipation, nausea, blood in stool, black stool, vomiting, bloating, unintentional weight loss, dysphagia.  She endorses occasionally taking Ibprofen but denies taking any antiacid or thyroid medications.   She reports a family history of maternal bile duct cancer. She states that her mother was diagnosed in 2022/07/26 and passed away few weeks later at the age of 48. She reports that her mother has two twin siblings, both are doing well. She denies any history of pancreatic cancer. She denies any family history of celiac disease.    GI Hx:  US ABDOMEN LIMITED RUQ 12-21-22 1. No cholelithiasis or sonographic evidence for  acute cholecystitis. 2. Increased hepatic parenchymal echogenicity suggestive of steatosis.  Colonoscopy 11-28-21 - Four 4 to 7 mm polyps in the sigmoid colon, removed with a cold snare. Resected and retrieved. - Non-bleeding external and internal hemorrhoids. Surgical [P], colon, sigmoid, polyp (4) - HYPERPLASTIC POLYP(S)  EGD 06-11-2009 1) Erosions, multiple  2) Otherwise normal examination   Current Outpatient Medications:    albuterol (VENTOLIN HFA) 108 (90 Base) MCG/ACT inhaler, Inhale 2 puffs into the lungs every 6 (six) hours as needed for wheezing or shortness of breath., Disp: 8 g, Rfl: 0   ELDERBERRY PO, Take 10 mLs by mouth daily., Disp: , Rfl:    levonorgestrel (MIRENA) 20 MCG/DAY IUD, 1 each by Intrauterine route once., Disp: , Rfl:    Multiple Vitamin (MULTIVITAMIN) LIQD, Take 20 mLs by mouth daily., Disp: , Rfl:    Allergies as of 01/26/2023 - Review Complete 01/26/2023  Allergen Reaction Noted   Latex Hives 10/13/2012   Monistat [miconazole]  09/27/2013   Prilosec [omeprazole] Hives 10/13/2012    Past Medical History:  Diagnosis Date   Abnormal Pap smear    yrs ago   Allergy    GERD (gastroesophageal reflux disease)    Thyroid nodule     Past Surgical History:  Procedure Laterality Date   BREAST REDUCTION SURGERY  2006   BREAST SURGERY     COLPOSCOPY     INTRAUTERINE DEVICE INSERTION     mirena insertion 2013, 10-05-17 removed & re-inserted  REDUCTION MAMMAPLASTY Bilateral 2006    Family History  Problem Relation Age of Onset   Hypertension Mother    Hyperlipidemia Mother    Stroke Mother    Cancer Mother        bile duct cancer   Colon polyps Mother    Breast cancer Mother 87   Heart attack Mother    Heart attack Father    Heart failure Father    Heart disease Father    Stroke Maternal Grandmother    Hypertension Maternal Grandmother    Heart disease Paternal Grandmother    Colon cancer Neg Hx    Esophageal cancer Neg Hx    Rectal cancer  Neg Hx    Stomach cancer Neg Hx     Social History   Socioeconomic History   Marital status: Married    Spouse name: Not on file   Number of children: 0   Years of education: Not on file   Highest education level: Master's degree (e.g., MA, MS, MEng, MEd, MSW, MBA)  Occupational History   Occupation: Fish farm manager  Tobacco Use   Smoking status: Never   Smokeless tobacco: Never  Vaping Use   Vaping Use: Never used  Substance and Sexual Activity   Alcohol use: Not Currently   Drug use: No   Sexual activity: Yes    Partners: Male    Birth control/protection: I.U.D.    Comment: Mirena-10/15/17  Other Topics Concern   Not on file  Social History Narrative   Not on file   Social Determinants of Health   Financial Resource Strain: Low Risk  (01/15/2023)   Overall Financial Resource Strain (CARDIA)    Difficulty of Paying Living Expenses: Not very hard  Food Insecurity: No Food Insecurity (01/15/2023)   Hunger Vital Sign    Worried About Running Out of Food in the Last Year: Never true    Ran Out of Food in the Last Year: Never true  Transportation Needs: No Transportation Needs (01/15/2023)   PRAPARE - Administrator, Civil Service (Medical): No    Lack of Transportation (Non-Medical): No  Physical Activity: Insufficiently Active (01/15/2023)   Exercise Vital Sign    Days of Exercise per Week: 3 days    Minutes of Exercise per Session: 30 min  Stress: No Stress Concern Present (01/15/2023)   [employer] of Occupational Health - Occupational Stress Questionnaire    Feeling of Stress : Only a little  Social Connections: Socially Integrated (01/15/2023)   Social Connection and Isolation Panel [NHANES]    Frequency of Communication with Friends and Family: More than three times a week    Frequency of Social Gatherings with Friends and Family: Once a week    Attends Religious Services: More than 4 times per year    Active Member of [employer] or Organizations: Yes     Attends Engineer, structural: More than 4 times per year    Marital Status: Married  Catering manager Violence: Not on file      Review of systems: Review of Systems  Constitutional:  Negative for unexpected weight change.  HENT:  Negative for trouble swallowing.   Gastrointestinal:  Positive for abdominal pain. Negative for abdominal distention, anal bleeding, blood in stool, constipation, diarrhea, nausea, rectal pain and vomiting.       +reflux       Physical Exam: Vitals:   01/26/23 1005  BP: 112/74  Pulse: 70   Body mass index is 34.67  kg/m.  General: well-appearing   Eyes: sclera anicteric, no redness CV: RRR, no JVD, no peripheral edema Resp: clear to auscultation bilaterally, normal RR and effort noted GI: soft, RUQ tenderness, with active bowel sounds. No guarding or palpable organomegaly noted. Skin; warm and dry, no rash or jaundice noted Neuro: awake, alert and oriented x 3. Normal gross motor function and fluent speech   Data Reviewed:  Reviewed labs, radiology imaging, old records and pertinent past GI work up   Assessment and Plan/Recommendations: 47 year old very pleasant female with complaints of worsening GERD symptoms, epigastric and right upper quadrant discomfort History of hives to Prilosec, will avoid PPI Schedule for EGD for further evaluation, exclude gastroduodenitis or peptic ulcer Start Voquenza 20 mg daily The risks and benefits as well as alternatives of endoscopic procedure(s) have been discussed and reviewed. All questions answered. The patient agrees to proceed.  If EGD unremarkable, will obtain HIDA scan to further evaluate right upper quadrant and epigastric abdominal discomfort.  IBS constipation: Discussed increasing dietary fiber and water intake Add Benefiber 1 tablespoon twice daily with meals  Family history positive for bile duct cancer, discussed in detail pathophysiology and lack of screening tools to detect  early bile duct cancer Will check ANA, anti-smooth muscle antibody, AMA, ANCA and CA 19-9 to check for any underlying liver disease and to reassure patient   This visit required 40 minutes of patient care (this includes precharting, chart review, review of results, face-to-face time used for counseling as well as treatment plan and follow-up. The patient was provided an opportunity to ask questions and all were answered. The patient agreed with the plan and demonstrated an understanding of the instructions.  Iona Beard , MD  CC: No ref. provider found   I,Safa M Kadhim,acting as a scribe for Marsa Aris, MD.,have documented all relevant documentation on the behalf of Marsa Aris, MD,as directed by  Marsa Aris, MD while in the presence of Marsa Aris, MD.   I, Marsa Aris, MD, have reviewed all documentation for this visit. The documentation on 01/26/23 for the exam, diagnosis, procedures, and orders are all accurate and complete.

## 2023-01-19 NOTE — Patient Instructions (Addendum)
Sent to [facility]  Address: [address] [place] [address] Phone: [phone]  Dr. Rexene Alberts [facility] Address: [address] [place] [address] Phone: [phone]  get X-ray/labs at [facility].  [address]  hours 8=M-F 8:30-5.  closed 12:30-1 lunch

## 2023-01-20 LAB — CBC WITH DIFFERENTIAL/PLATELET
Absolute Monocytes: 524 cells/uL (ref 200–950)
Basophils Absolute: 28 cells/uL (ref 0–200)
Basophils Relative: 0.4 %
Eosinophils Absolute: 159 cells/uL (ref 15–500)
Hemoglobin: 12.9 g/dL (ref 11.7–15.5)
Lymphs Abs: 2091 cells/uL (ref 850–3900)
MPV: 11 fL (ref 7.5–12.5)
Monocytes Relative: 7.6 %
Neutro Abs: 4099 cells/uL (ref 1500–7800)
Neutrophils Relative %: 59.4 %
RDW: 13 % (ref 11.0–15.0)
Total Lymphocyte: 30.3 %

## 2023-01-20 LAB — COMPREHENSIVE METABOLIC PANEL
AG Ratio: 1.2 (calc) (ref 1.0–2.5)
ALT: 10 U/L (ref 6–29)
AST: 14 U/L (ref 10–35)
Albumin: 3.8 g/dL (ref 3.6–5.1)
Alkaline phosphatase (APISO): 94 U/L (ref 31–125)
BUN: 9 mg/dL (ref 7–25)
CO2: 24 mmol/L (ref 20–32)
Calcium: 9.4 mg/dL (ref 8.6–10.2)
Chloride: 105 mmol/L (ref 98–110)
Creat: 0.74 mg/dL (ref 0.50–0.99)
Globulin: 3.1 g/dL (calc) (ref 1.9–3.7)
Glucose, Bld: 75 mg/dL (ref 65–99)
Potassium: 4.6 mmol/L (ref 3.5–5.3)
Sodium: 140 mmol/L (ref 135–146)
Total Bilirubin: 0.5 mg/dL (ref 0.2–1.2)
Total Protein: 6.9 g/dL (ref 6.1–8.1)

## 2023-01-26 ENCOUNTER — Encounter: Payer: Self-pay | Admitting: Gastroenterology

## 2023-01-26 ENCOUNTER — Other Ambulatory Visit (INDEPENDENT_AMBULATORY_CARE_PROVIDER_SITE_OTHER): Payer: 59

## 2023-01-26 ENCOUNTER — Ambulatory Visit: Payer: 59 | Admitting: Gastroenterology

## 2023-01-26 VITALS — BP 112/74 | HR 70 | Ht 67.25 in | Wt 223.0 lb

## 2023-01-26 DIAGNOSIS — K219 Gastro-esophageal reflux disease without esophagitis: Secondary | ICD-10-CM

## 2023-01-26 DIAGNOSIS — Z800 Family history of malignant neoplasm of digestive organs: Secondary | ICD-10-CM

## 2023-01-26 DIAGNOSIS — R1013 Epigastric pain: Secondary | ICD-10-CM

## 2023-01-26 DIAGNOSIS — R1011 Right upper quadrant pain: Secondary | ICD-10-CM

## 2023-01-26 LAB — SEDIMENTATION RATE: Sed Rate: 52 mm/hr — ABNORMAL HIGH (ref 0–20)

## 2023-01-26 MED ORDER — VOQUEZNA 20 MG PO TABS: 1.0000 | ORAL_TABLET | Freq: Every day | ORAL | 3 refills | Status: DC

## 2023-01-26 NOTE — Patient Instructions (Addendum)
Your provider has requested that you go to the basement level for lab work before leaving today. Press "B" on the elevator. The lab is located at the first door on the left as you exit the elevator.   You have been scheduled for an endoscopy. Please follow written instructions given to you at your visit today. If you use inhalers (even only as needed), please bring them with you on the day of your procedure.  We have sent Voquenza to a pharmacy called [facility], You should hear from a specialist from there if you do not their number is [phone]   Due to recent changes in healthcare laws, you may see the results of your imaging and laboratory studies on MyChart before your provider has had a chance to review them.  We understand that in some cases there may be results that are confusing or concerning to you. Not all laboratory results come back in the same time frame and the provider may be waiting for multiple results in order to interpret others.  Please give [place] 48 hours in order for your provider to thoroughly review all the results before contacting the office for clarification of your results.    I appreciate the  opportunity to care for you  Thank You   Marsa Aris , MD

## 2023-01-27 LAB — ANCA PROFILE
Atypical pANCA: <1:20 {titer}
C-ANCA: <1:20 {titer}

## 2023-01-27 LAB — CANCER ANTIGEN 19-9: CA 19-9: 15 U/mL (ref <34)

## 2023-01-27 LAB — ALPHA-1-ANTITRYPSIN: A-1 Antitrypsin, Ser: 114 mg/dL (ref 83–199)

## 2023-01-28 ENCOUNTER — Ambulatory Visit
Admission: RE | Admit: 2023-01-28 | Discharge: 2023-01-28 | Disposition: A | Discharge status: Home / Self Care | Payer: 59 | Source: Ambulatory Visit | Attending: Family Medicine | Admitting: Family Medicine

## 2023-01-28 DIAGNOSIS — R591 Generalized enlarged lymph nodes: Secondary | ICD-10-CM

## 2023-01-28 LAB — ANCA PROFILE
Anti-MPO Antibodies: <0.2 units (ref 0.0–0.9)
P-ANCA: <1:20 {titer}

## 2023-01-28 LAB — ANTI-NUCLEAR AB-TITER (ANA TITER)

## 2023-01-28 MED ORDER — IOPAMIDOL (ISOVUE-300) INJECTION 61%
75.0000 mL | Freq: Once | INTRAVENOUS | Status: AC | PRN
  Administered 2023-01-28: 75 mL via INTRAVENOUS

## 2023-02-01 LAB — MITOCHONDRIAL ANTIBODIES: Mitochondrial M2 Ab, IgG: <=20 U (ref <=20.0)

## 2023-02-01 LAB — ANTI-SMOOTH MUSCLE ANTIBODY, IGG: Actin (Smooth Muscle) Antibody (IGG): <20 U (ref <20)

## 2023-02-01 LAB — ANTI-NUCLEAR AB-TITER (ANA TITER): ANA Titer 1: 1:320 {titer} — ABNORMAL HIGH

## 2023-02-01 LAB — ANA: Anti Nuclear Antibody (ANA): POSITIVE — AB

## 2023-02-10 ENCOUNTER — Other Ambulatory Visit: Payer: Self-pay

## 2023-02-10 DIAGNOSIS — R768 Other specified abnormal immunological findings in serum: Secondary | ICD-10-CM

## 2023-02-23 ENCOUNTER — Ambulatory Visit (HOSPITAL_BASED_OUTPATIENT_CLINIC_OR_DEPARTMENT_OTHER): Payer: 59 | Admitting: Cardiology

## 2023-02-23 ENCOUNTER — Encounter (HOSPITAL_BASED_OUTPATIENT_CLINIC_OR_DEPARTMENT_OTHER): Payer: Self-pay | Admitting: Cardiology

## 2023-02-23 VITALS — BP 134/98 | HR 65 | Ht 67.0 in | Wt 229.8 lb

## 2023-02-23 DIAGNOSIS — Z712 Person consulting for explanation of examination or test findings: Secondary | ICD-10-CM

## 2023-02-23 DIAGNOSIS — I7121 Aneurysm of the ascending aorta, without rupture: Secondary | ICD-10-CM | POA: Diagnosis not present

## 2023-02-23 DIAGNOSIS — Z7189 Other specified counseling: Secondary | ICD-10-CM | POA: Diagnosis not present

## 2023-02-23 DIAGNOSIS — Z8249 Family history of ischemic heart disease and other diseases of the circulatory system: Secondary | ICD-10-CM | POA: Diagnosis not present

## 2023-02-23 NOTE — Patient Instructions (Signed)
Medication Instructions:  Continue current medications  *If you need a refill on your cardiac medications before your next appointment, please call your pharmacy*   Lab Work: None Ordered   Testing/Procedures: Your physician has requested that you have an echocardiogram in 6 Months. Echocardiography is a painless test that uses sound waves to create images of your heart. It provides your doctor with information about the size and shape of your heart and how well your heart's chambers and valves are working. This procedure takes approximately one hour. There are no restrictions for this procedure. Please do NOT wear cologne, perfume, aftershave, or lotions (deodorant is allowed). Please arrive 15 minutes prior to your appointment time.   Follow-Up: At [facility], you and your health needs are our priority.  As part of our continuing mission to provide you with exceptional heart care, we have created designated Provider Care Teams.  These Care Teams include your primary Cardiologist (physician) and Advanced Practice Providers (APPs -  Physician Assistants and Nurse Practitioners) who all work together to provide you with the care you need, when you need it.  We recommend signing up for the patient portal called "MyChart".  Sign up information is provided on this After Visit Summary.  MyChart is used to connect with patients for Virtual Visits (Telemedicine).  Patients are able to view lab/test results, encounter notes, upcoming appointments, etc.  Non-urgent messages can be sent to your provider as well.   To learn more about what you can do with MyChart, go to [email or web address].    Your next appointment:   1 year(s)  Provider:   Jodelle Red, MD    Other Instructions

## 2023-02-23 NOTE — Progress Notes (Signed)
Cardiology Office Note:    Date:  02/23/2023   ID:  Christina Hardy, DOB 12/08/1975, MRN [id number]  PCP:  Christina Sow, MD  Cardiologist:  Jodelle Red, MD  Referring MD: Christina Sow, MD   CC: New patient evaluation for ascending aortic aneurysm  History of Present Illness:    Christina Hardy is a 47 y.o. female with a hx of ascending aortic aneurysm, thyroid nodule, and GERD, who is seen as a new consult at the request of Christina Sow, MD for the evaluation and management of ascending aortic aneurysm.  In 11/2022 she saw her PCP Dr. Ruthine Hardy for evaluation of ongoing symptoms concerning for pneumonia. Chest CT was ordered and showed ascending thoracic aortic aneurysm of 4 cm maximum diameter. Also found to have multinodular goiter and elevated left hemidiaphragm. No pneumonia identified. She had a virtual visit with her PCP 01/01/2023 to discuss her results. It was noted that previous imaging showed her aortic aneurysm at 3.5 cm in 2007. Her elevated left hemidiaphragm had also been present since at least 2007. She was referred to cardiology for further evaluation.  Today, she is accompanied by her husband.   Cardiovascular risk factors: Prior clinical ASCVD:  None. Comorbid conditions: She denies hypertension. Metabolic syndrome/Obesity: Current weight is 229 lbs. Chronic inflammatory conditions:  None. Tobacco use history: Never. Family history:  Her mother had a stroke when she was 34 yo, as she was healing from the stroke she discovered that she had a rare cancer and passed away two weeks later. Her father died suddenly of arteriosclerosis at 48 yo; he had no issues prior to that. Her maternal uncle died of a brain aneurysm. Prior cardiac testing and/or incidental findings on other testing: Chest CT 12/2022 showed ascending thoracic aortic aneurysm, 4 cm diameter (3.5 cm per CTA chest 2007).   She denies any palpitations, chest pain, shortness of breath, or peripheral edema.  No lightheadedness, headaches, syncope, orthopnea, or PND.  Past Medical History:  Diagnosis Date   Abnormal Pap smear    yrs ago   Allergy    GERD (gastroesophageal reflux disease)    Thyroid nodule     Past Surgical History:  Procedure Laterality Date   BREAST REDUCTION SURGERY  2006   BREAST SURGERY     COLPOSCOPY     INTRAUTERINE DEVICE INSERTION     mirena insertion 2013, 10-05-17 removed & re-inserted   REDUCTION MAMMAPLASTY Bilateral 2006    Current Medications: Current Outpatient Medications on File Prior to Visit  Medication Sig   albuterol (VENTOLIN HFA) 108 (90 Base) MCG/ACT inhaler Inhale 2 puffs into the lungs every 6 (six) hours as needed for wheezing or shortness of breath.   ELDERBERRY PO Take 10 mLs by mouth daily.   levonorgestrel (MIRENA) 20 MCG/DAY IUD 1 each by Intrauterine route once.   Multiple Vitamin (MULTIVITAMIN) LIQD Take 20 mLs by mouth daily.   Vonoprazan Fumarate (VOQUEZNA) 20 MG TABS Take 1 tablet by mouth daily in the afternoon.   No current facility-administered medications on file prior to visit.     Allergies:   Latex, Monistat [miconazole], and Prilosec [omeprazole]   Social History   Tobacco Use   Smoking status: Never   Smokeless tobacco: Never  Vaping Use   Vaping Use: Never used  Substance Use Topics   Alcohol use: Not Currently   Drug use: No    Family History: family history includes Breast cancer (age of onset: 47) in her mother; Cancer  in her mother; Colon polyps in her mother; Heart attack in her father and mother; Heart disease in her father and paternal grandmother; Heart failure in her father; Hyperlipidemia in her mother; Hypertension in her maternal grandmother and mother; Stroke in her maternal grandmother and mother. There is no history of Colon cancer, Esophageal cancer, Rectal cancer, or Stomach cancer.  ROS:   Please see the history of present illness.  Additional pertinent ROS: Constitutional: Negative for  chills, fever, night sweats, unintentional weight loss  HENT: Negative for ear pain and hearing loss.   Eyes: Negative for loss of vision and eye pain.  Respiratory: Negative for cough, sputum, wheezing.   Cardiovascular: See HPI. Gastrointestinal: Negative for abdominal pain, melena, and hematochezia.  Genitourinary: Negative for dysuria and hematuria.  Musculoskeletal: Negative for falls and myalgias.  Skin: Negative for itching and rash.  Neurological: Negative for focal weakness, focal sensory changes and loss of consciousness.  Endo/Heme/Allergies: Does not bruise/bleed easily.     EKGs/Labs/Other Studies Reviewed:    The following studies were reviewed today:  Soft Tissue/Neck CT  01/28/2023: IMPRESSION: 1. 5.4 cm right thyroid nodule with mild tracheal mass effect. The nodule has been previously biopsied and has enlarged since a 2020 ultrasound when it measured up to 4.4 cm. 2. No adenopathy.  Chest CT  12/28/2022: FINDINGS: Cardiovascular: Ascending thoracic aorta aneurysmal up to 4 cm maximum diameter. No cardiomegaly or pericardial effusion.   Mediastinum/Nodes: Substernal goiter identified on the right. This can be correlated with outpatient ultrasound. No suspicious mediastinal or hilar adenopathy identified is seen. Esophagus and tracheobronchial tree appear unremarkable.   Lungs/Pleura: Lungs are clear. No pleural effusion or pneumothorax.   Upper Abdomen: No acute abnormality. Left hemidiaphragm is elevated.   Musculoskeletal: No chest wall abnormality. No acute or significant osseous findings.  IMPRESSION: 1. Ascending thoracic aortic aneurysm, 4 cm diameter. 2. 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 Ultrasound correlation recommended. 3. No pneumonia identified. 4. Elevated left hemidiaphragm.  CTA Chest  10/23/2005: Findings: The aortic root measures 3.5 x 3.5 cm in maximum AP and transverse  dimensions on image #17 of series 5. At  the level of the main pulmonary artery,  the ascending aorta measures 3.6 x 3.5 cm in maximum AP and transverse  dimensions respectively. The descending thoracic aorta measures 2.1 x 1.8 cm in  maximum corresponding dimensions at the same level. No dissection seen. Clear  lungs. No masses or enlarged lymph nodes. Normal sized heart. Unremarkable upper  abdomen.    IMPRESSION  Normal examination.   EKG:  EKG is personally reviewed.   02/23/2023:  NSR at 65 bpm  Recent Labs: 11/02/2022: TSH 1.12 01/19/2023: ALT 10; BUN 9; Creat 0.74; Hemoglobin 12.9; Platelets 186; Potassium 4.6; Sodium 140   Recent Lipid Panel    Component Value Date/Time   CHOL 198 11/02/2022 0933   CHOL 188 08/19/2020 0909   TRIG 91 11/02/2022 0933   HDL 53 11/02/2022 0933   HDL 44 08/19/2020 0909   CHOLHDL 3.7 11/02/2022 0933   VLDL 13 03/12/2016 1454   LDLCALC 126 (H) 11/02/2022 0933    Physical Exam:    VS:  BP (!) 134/98 (BP Location: Left Arm, Patient Position: Sitting, Cuff Size: Large)   Pulse 65   Ht 5\' 7"  (1.702 m)   Wt 229 lb 12.8 oz (104.2 kg)   BMI 35.99 kg/m     Wt Readings from Last 3 Encounters:  02/23/23 229 lb 12.8 oz (104.2  kg)  01/26/23 223 lb (101.2 kg)  01/19/23 225 lb 8 oz (102.3 kg)    GEN: Well nourished, well developed in no acute distress HEENT: Normal, moist mucous membranes NECK: No JVD CARDIAC: regular rhythm, normal S1 and S2, no rubs or gallops. No murmur. VASCULAR: Radial and DP pulses 2+ bilaterally. No carotid bruits RESPIRATORY:  Clear to auscultation without rales, wheezing or rhonchi  ABDOMEN: Soft, non-tender, non-distended MUSCULOSKELETAL:  Ambulates independently SKIN: Warm and dry, no edema NEUROLOGIC:  Alert and oriented x 3. No focal neuro deficits noted. PSYCHIATRIC:  Normal affect    ASSESSMENT:    1. Aneurysm of ascending aorta without rupture (HCC)   2. Encounter to discuss test results   3. Cardiac risk counseling   4. Counseling on health  promotion and disease prevention   5. Family history of heart disease    PLAN:    Ascending thoracic aortic aneurysm -measured at 4.0 cm on CT, reviewed results -discussed CT vs echo for repeat, pros/cons. Will get echo in 6 mos, if TAA significantly enlarged would confirm with CTA -no history of hypertension, though slightly elevated today  Family history of heart disease -personally reviewed her CT, I did not detect any coronary calcium -we did discuss statin given family history -reviewed red flag warning signs that need immediate medical attention  Cardiac risk counseling and prevention recommendations: -recommend heart healthy/Mediterranean diet, with whole grains, fruits, vegetable, fish, lean meats, nuts, and olive oil. Limit salt. -recommend moderate walking, 3-5 times/week for 30-50 minutes each session. Aim for at least 150 minutes.week. Goal should be pace of 3 miles/hours, or walking 1.5 miles in 30 minutes -recommend avoidance of tobacco products. Avoid excess alcohol. -ASCVD risk score: The 10-year ASCVD risk score (Arnett DK, et al., 2019) is: 2%   Values used to calculate the score:     Age: 20 years     Sex: Female     Is Non-Hispanic African American: Yes     Diabetic: No     Tobacco smoker: No     Systolic Blood Pressure: 134 mmHg     Is BP treated: No     HDL Cholesterol: 53 mg/dL     Total Cholesterol: 198 mg/dL    Plan for follow up: Echocardiogram in 6 months. Follow-up in 1 year or sooner as needed.  Jodelle Red, MD, PhD, [facility]  [facility]    Medication Adjustments/Labs and Tests Ordered: Current medicines are reviewed at length with the patient today.  Concerns regarding medicines are outlined above.   Orders Placed This Encounter  Procedures   EKG 12-Lead   ECHOCARDIOGRAM COMPLETE   No orders of the defined types were placed in this encounter.  Patient Instructions  Medication Instructions:  Continue current  medications  *If you need a refill on your cardiac medications before your next appointment, please call your pharmacy*   Lab Work: None Ordered   Testing/Procedures: Your physician has requested that you have an echocardiogram in 6 Months. Echocardiography is a painless test that uses sound waves to create images of your heart. It provides your doctor with information about the size and shape of your heart and how well your heart's chambers and valves are working. This procedure takes approximately one hour. There are no restrictions for this procedure. Please do NOT wear cologne, perfume, aftershave, or lotions (deodorant is allowed). Please arrive 15 minutes prior to your appointment time.   Follow-Up: At [facility], you and your  health needs are our priority.  As part of our continuing mission to provide you with exceptional heart care, we have created designated Provider Care Teams.  These Care Teams include your primary Cardiologist (physician) and Advanced Practice Providers (APPs -  Physician Assistants and Nurse Practitioners) who all work together to provide you with the care you need, when you need it.  We recommend signing up for the patient portal called "MyChart".  Sign up information is provided on this After Visit Summary.  MyChart is used to connect with patients for Virtual Visits (Telemedicine).  Patients are able to view lab/test results, encounter notes, upcoming appointments, etc.  Non-urgent messages can be sent to your provider as well.   To learn more about what you can do with MyChart, go to [email or web address].    Your next appointment:   1 year(s)  Provider:   Jodelle Red, MD    Other Instructions     I,Mathew Stumpf,acting as a scribe for Jodelle Red, MD.,have documented all relevant documentation on the behalf of Jodelle Red, MD,as directed by  Jodelle Red, MD while in the presence of Jodelle Red, MD.  I, Jodelle Red, MD, have reviewed all documentation for this visit. The documentation on 02/23/23 for the exam, diagnosis, procedures, and orders are all accurate and complete.   Signed, Jodelle Red, MD PhD 02/23/2023 5:15 PM    [facility]

## 2023-03-10 ENCOUNTER — Encounter: Payer: Self-pay | Admitting: Gastroenterology

## 2023-03-10 ENCOUNTER — Ambulatory Visit (AMBULATORY_SURGERY_CENTER): Payer: 59 | Admitting: Gastroenterology

## 2023-03-10 VITALS — BP 136/87 | HR 77 | Temp 96.9°F | Resp 14 | Ht 67.25 in | Wt 223.0 lb

## 2023-03-10 DIAGNOSIS — R1013 Epigastric pain: Secondary | ICD-10-CM

## 2023-03-10 DIAGNOSIS — K2970 Gastritis, unspecified, without bleeding: Secondary | ICD-10-CM

## 2023-03-10 DIAGNOSIS — K2950 Unspecified chronic gastritis without bleeding: Secondary | ICD-10-CM

## 2023-03-10 MED ORDER — SODIUM CHLORIDE 0.9 % IV SOLN
500.0000 mL | INTRAVENOUS | Status: DC
Start: 2023-03-10 — End: 2023-03-10

## 2023-03-10 MED ORDER — VOQUEZNA 20 MG PO TABS: 1.0000 | ORAL_TABLET | Freq: Every day | ORAL | 3 refills | Status: AC

## 2023-03-10 NOTE — Op Note (Addendum)
[facility] Patient Name: Christina Hardy Procedure Date: 03/10/2023 10:08 AM MRN: [id number] Endoscopist: Napoleon Form , MD, [id number] Age: 47 Referring MD:  Date of Birth: Apr 02, 1976 Gender: Female Account #: [id number] Procedure:                Upper GI endoscopy Indications:              Epigastric abdominal pain Medicines:                Monitored Anesthesia Care Procedure:                Pre-Anesthesia Assessment:                           - Prior to the procedure, a History and Physical                            was performed, and patient medications and                            allergies were reviewed. The patient's tolerance of                            previous anesthesia was also reviewed. The risks                            and benefits of the procedure and the sedation                            options and risks were discussed with the patient.                            All questions were answered, and informed consent                            was obtained. Prior Anticoagulants: The patient has                            taken no anticoagulant or antiplatelet agents. ASA                            Grade Assessment: II - A patient with mild systemic                            disease. After reviewing the risks and benefits,                            the patient was deemed in satisfactory condition to                            undergo the procedure.                           After obtaining informed consent, the endoscope was  passed under direct vision. Throughout the                            procedure, the patient's blood pressure, pulse, and                            oxygen saturations were monitored continuously. The                            Olympus Scope [id number] was introduced through the                            mouth, and advanced to the second part of duodenum.                            The upper GI  endoscopy was accomplished without                            difficulty. The patient tolerated the procedure                            well. Scope In: Scope Out: Findings:                 The Z-line was regular and was found 38 cm from the                            incisors.                           The examined esophagus was normal.                           Patchy mild inflammation characterized by                            congestion (edema), erosions and erythema was found                            in the entire examined stomach. Biopsies were taken                            with a cold forceps for histology. Biopsies were                            taken with a cold forceps for Helicobacter pylori                            testing.                           A small hiatal hernia was present.                           The cardia and gastric fundus were normal on  retroflexion.                           The examined duodenum was normal. Complications:            No immediate complications. Estimated Blood Loss:     Estimated blood loss was minimal. Impression:               - Z-line regular, 38 cm from the incisors.                           - Normal esophagus.                           - Gastritis. Biopsied.                           - Small hiatal hernia.                           - Normal examined duodenum. Recommendation:           - Patient has a contact number available for                            emergencies. The signs and symptoms of potential                            delayed complications were discussed with the                            patient. Return to normal activities tomorrow.                            Written discharge instructions were provided to the                            patient.                           - Resume previous diet.                           - Continue present medications.                           -  Await pathology results.                           - Follow an antireflux regimen.                           - Continue Voquenza.                           - Avoid or limit NSAID's use Napoleon Form, MD 03/10/2023 10:37:03 AM This report has been signed electronically.

## 2023-03-10 NOTE — Progress Notes (Signed)
Uneventful anesthetic. Report to pacu rn. Vss. Care resumed by rn. 

## 2023-03-10 NOTE — Progress Notes (Signed)
Called to room to assist during endoscopic procedure.  Patient ID and intended procedure confirmed with present staff. Received instructions for my participation in the procedure from the performing physician.  

## 2023-03-10 NOTE — Progress Notes (Signed)
Pt's states no medical or surgical changes since previsit or office visit. 

## 2023-03-10 NOTE — Progress Notes (Signed)
[facility] History and Physical   Primary Care Physician:  Jeani Sow, MD   Reason for Procedure:  Epigastric pain  Plan:    EGD with possible interventions as needed     HPI: Christina Hardy is a very pleasant 47 y.o. female here for EGD for evaluation of epigastric pain. Denies any nausea, vomiting, abdominal pain, melena or bright red blood per rectum  The risks and benefits as well as alternatives of endoscopic procedure(s) have been discussed and reviewed. All questions answered. The patient agrees to proceed.    Past Medical History:  Diagnosis Date   Abnormal Pap smear    yrs ago   Allergy    Aortic aneurysm (HCC)    GERD (gastroesophageal reflux disease)    Thyroid nodule     Past Surgical History:  Procedure Laterality Date   BREAST REDUCTION SURGERY  2006   BREAST SURGERY     COLPOSCOPY     INTRAUTERINE DEVICE INSERTION     mirena insertion 2013, 10-05-17 removed & re-inserted   REDUCTION MAMMAPLASTY Bilateral 2006    Prior to Admission medications   Medication Sig Start Date End Date Taking? Authorizing Provider  Multiple Vitamin (MULTIVITAMIN) LIQD Take 20 mLs by mouth daily.   Yes [provider]  Vonoprazan Fumarate (VOQUEZNA) 20 MG TABS Take 1 tablet by mouth daily in the afternoon. 01/26/23  Yes Kaleah Hagemeister, Eleonore Chiquito, MD  albuterol (VENTOLIN HFA) 108 (90 Base) MCG/ACT inhaler Inhale 2 puffs into the lungs every 6 (six) hours as needed for wheezing or shortness of breath. 12/14/22   Jeani Sow, MD  ELDERBERRY PO Take 10 mLs by mouth daily.    [provider]  levonorgestrel (MIRENA) 20 MCG/DAY IUD 1 each by Intrauterine route once.    [provider]    Current Outpatient Medications  Medication Sig Dispense Refill   Multiple Vitamin (MULTIVITAMIN) LIQD Take 20 mLs by mouth daily.     Vonoprazan Fumarate (VOQUEZNA) 20 MG TABS Take 1 tablet by mouth daily in the afternoon. 30 tablet 3   albuterol (VENTOLIN  HFA) 108 (90 Base) MCG/ACT inhaler Inhale 2 puffs into the lungs every 6 (six) hours as needed for wheezing or shortness of breath. 8 g 0   ELDERBERRY PO Take 10 mLs by mouth daily.     levonorgestrel (MIRENA) 20 MCG/DAY IUD 1 each by Intrauterine route once.     Current Facility-Administered Medications  Medication Dose Route Frequency Provider Last Rate Last Admin   0.9 %  sodium chloride infusion  500 mL Intravenous Continuous Edgerrin Correia, Eleonore Chiquito, MD        Allergies as of 03/10/2023 - Review Complete 03/10/2023  Allergen Reaction Noted   Latex Hives 10/13/2012   Monistat [miconazole]  09/27/2013   Prilosec [omeprazole] Hives 10/13/2012    Family History  Problem Relation Age of Onset   Hypertension Mother    Hyperlipidemia Mother    Stroke Mother    Cancer Mother        bile duct cancer   Colon polyps Mother    Breast cancer Mother 19   Heart attack Mother    Heart attack Father    Heart failure Father    Heart disease Father    Stroke Maternal Grandmother    Hypertension Maternal Grandmother    Heart disease Paternal Grandmother    Colon cancer Neg Hx    Esophageal cancer Neg Hx    Rectal cancer Neg Hx  Stomach cancer Neg Hx     Social History   Socioeconomic History   Marital status: Married    Spouse name: Not on file   Number of children: 0   Years of education: Not on file   Highest education level: Master's degree (e.g., MA, MS, MEng, MEd, MSW, MBA)  Occupational History   Occupation: Fish farm manager  Tobacco Use   Smoking status: Never   Smokeless tobacco: Never  Vaping Use   Vaping Use: Never used  Substance and Sexual Activity   Alcohol use: Not Currently   Drug use: No   Sexual activity: Yes    Partners: Male    Birth control/protection: I.U.D.    Comment: Mirena-10/15/17  Other Topics Concern   Not on file  Social History Narrative   Not on file   Social Determinants of Health   Financial Resource Strain: Low Risk  (01/15/2023)    Overall Financial Resource Strain (CARDIA)    Difficulty of Paying Living Expenses: Not very hard  Food Insecurity: No Food Insecurity (01/15/2023)   Hunger Vital Sign    Worried About Running Out of Food in the Last Year: Never true    Ran Out of Food in the Last Year: Never true  Transportation Needs: No Transportation Needs (01/15/2023)   PRAPARE - Administrator, Civil Service (Medical): No    Lack of Transportation (Non-Medical): No  Physical Activity: Insufficiently Active (01/15/2023)   Exercise Vital Sign    Days of Exercise per Week: 3 days    Minutes of Exercise per Session: 30 min  Stress: No Stress Concern Present (01/15/2023)   [employer] of Occupational Health - Occupational Stress Questionnaire    Feeling of Stress : Only a little  Social Connections: Socially Integrated (01/15/2023)   Social Connection and Isolation Panel [NHANES]    Frequency of Communication with Friends and Family: More than three times a week    Frequency of Social Gatherings with Friends and Family: Once a week    Attends Religious Services: More than 4 times per year    Active Member of [employer] or Organizations: Yes    Attends Engineer, structural: More than 4 times per year    Marital Status: Married  Catering manager Violence: Not on file    Review of Systems:  All other review of systems negative except as mentioned in the HPI.  Physical Exam: Vital signs in last 24 hours: Blood Pressure 128/86   Pulse 70   Temperature (Abnormal) 96.9 F (36.1 C) (Temporal)   Height 5' 7.25" (1.708 m)   Weight 223 lb (101.2 kg)   Oxygen Saturation 97%   Body Mass Index 34.67 kg/m  General:   Alert, NAD Lungs:  Clear .   Heart:  Regular rate and rhythm Abdomen:  Soft, nontender and nondistended. Neuro/Psych:  Alert and cooperative. Normal mood and affect. A and O x 3  Reviewed labs, radiology imaging, old records and pertinent past GI work up  Patient is appropriate for  planned procedure(s) and anesthesia in an ambulatory setting   K. Scherry Ran , MD [phone]

## 2023-03-10 NOTE — Patient Instructions (Signed)
Please read handouts provided. Continue present medications. Await pathology results. Follow an antireflux regimen. Avoid or limit NSAID's use.    YOU HAD AN ENDOSCOPIC PROCEDURE TODAY AT THE [facility]:   Refer to the procedure report that was given to you for any specific questions about what was found during the examination.  If the procedure report does not answer your questions, please call your gastroenterologist to clarify.  If you requested that your care partner not be given the details of your procedure findings, then the procedure report has been included in a sealed envelope for you to review at your convenience later.  YOU SHOULD EXPECT: Some feelings of bloating in the abdomen. Passage of more gas than usual.  Walking can help get rid of the air that was put into your GI tract during the procedure and reduce the bloating. If you had a lower endoscopy (such as a colonoscopy or flexible sigmoidoscopy) you may notice spotting of blood in your stool or on the toilet paper. If you underwent a bowel prep for your procedure, you may not have a normal bowel movement for a few days.  Please Note:  You might notice some irritation and congestion in your nose or some drainage.  This is from the oxygen used during your procedure.  There is no need for concern and it should clear up in a day or so.  SYMPTOMS TO REPORT IMMEDIATELY:  Following upper endoscopy (EGD)  Vomiting of blood or coffee ground material  New chest pain or pain under the shoulder blades  Painful or persistently difficult swallowing  New shortness of breath  Fever of 100F or higher  Black, tarry-looking stools  For urgent or emergent issues, a gastroenterologist can be reached at any hour by calling [phone]. Do not use MyChart messaging for urgent concerns.    DIET:  We do recommend a small meal at first, but then you may proceed to your regular diet.  Drink plenty of fluids but you should avoid  alcoholic beverages for 24 hours.  ACTIVITY:  You should plan to take it easy for the rest of today and you should NOT DRIVE or use heavy machinery until tomorrow (because of the sedation medicines used during the test).    FOLLOW UP: Our staff will call the number listed on your records the next business day following your procedure.  We will call around 7:15- 8:00 am to check on you and address any questions or concerns that you may have regarding the information given to you following your procedure. If we do not reach you, we will leave a message.     If any biopsies were taken you will be contacted by phone or by letter within the next 1-3 weeks.  Please call us at [phone] if you have not heard about the biopsies in 3 weeks.    SIGNATURES/CONFIDENTIALITY: You and/or your care partner have signed paperwork which will be entered into your electronic medical record.  These signatures attest to the fact that that the information above on your After Visit Summary has been reviewed and is understood.  Full responsibility of the confidentiality of this discharge information lies with you and/or your care-partner.

## 2023-03-11 ENCOUNTER — Telehealth: Payer: Self-pay | Admitting: *Deleted

## 2023-03-11 NOTE — Telephone Encounter (Signed)
  Follow up Call-     03/10/2023    9:23 AM 11/28/2021    2:33 PM  Call back number  Post procedure Call Back phone  # [phone]  Permission to leave phone message Yes Yes     Patient questions:  Do you have a fever, pain , or abdominal swelling? No. Pain Score  0 *  Have you tolerated food without any problems? Yes.    Have you been able to return to your normal activities? Yes.    Do you have any questions about your discharge instructions: Diet   No. Medications  No. Follow up visit  No.  Do you have questions or concerns about your Care? No.  Actions: * If pain score is 4 or above: No action needed, pain <4.

## 2023-03-15 ENCOUNTER — Other Ambulatory Visit: Payer: Self-pay | Admitting: *Deleted

## 2023-03-18 ENCOUNTER — Encounter: Payer: Self-pay | Admitting: Gastroenterology

## 2023-03-18 ENCOUNTER — Other Ambulatory Visit: Payer: Self-pay | Admitting: Surgery

## 2023-03-18 DIAGNOSIS — E041 Nontoxic single thyroid nodule: Secondary | ICD-10-CM

## 2023-03-23 ENCOUNTER — Ambulatory Visit
Admission: RE | Admit: 2023-03-23 | Discharge: 2023-03-23 | Disposition: A | Discharge status: Home / Self Care | Payer: 59 | Source: Ambulatory Visit | Attending: Surgery | Admitting: Surgery

## 2023-03-23 DIAGNOSIS — E041 Nontoxic single thyroid nodule: Secondary | ICD-10-CM

## 2023-03-25 NOTE — Progress Notes (Signed)
USN shows that the dominant nodule has increased significantly in size.  As we discussed at the office, I feel you should consider surgical resection of the right thyroid lobe.  There is already compression of your airway noted on CT scan.  There is no need for repeat biopsy if we are going to plan surgery.  Please discuss this with your family and let us know if you would like to consider surgery to remove.  tmg  Darnell Level, MD [facility] practice Office: [phone]

## 2023-07-13 NOTE — Progress Notes (Signed)
Office Visit Note  Patient: Christina Hardy             Date of Birth: 11/23/1975           MRN: [id number]             PCP: Jeani Sow, MD Referring: Napoleon Form, MD Visit Date: 07/27/2023 Occupation: @GUAROCC @ Subjective:  Positive ANA  History of Present Illness: Christina Hardy is a 47 y.o. female seen in consultation per request of Dr. Lavon Paganini.  According the patient her mother passed away from bile duct cancer in October 2023.  Patient got concerned and was evaluated by Dr. Lavon Paganini.  She had been experiencing heartburn for which she was treated.  She also had ultrasound of her abdomen which was unremarkable except for fatty liver.  She had colonoscopy which showed sigmoidal colon polyps which were removed.  She had an EGD in the past which showed multiple erosions.  Her gastroenterologist also obtain some lab work and her ANA was positive.  For that reason she was referred to me for evaluation.  Patient denies any history of oral ulcers, nasal ulcers, Raynaud's phenomenon, rashes, lymphadenopathy, joint pain or joint swelling.  She gives history of photosensitivity.  She states her mouth gets dry if she drinks sugary drinks.  There is no family history of autoimmune disease.  He is gravida 0.  She uses Mirena IUD for contraception.  Patient recalls having pneumonia in February 2024.  She had a repeat chest x-ray in April 2024 which showed resolution of pneumonia.  She also had a CT scan of her chest in April 2024 which showed ascending thoracic aortic aneurysm 4 cm.  It also showed multinodular goiter and elevated left hemidiaphragm.  She had an ultrasound of the thyroid which showed multinodular goiter.     Activities of Daily Living:  Patient reports morning stiffness for a few minutes.   Patient Denies nocturnal pain.  Difficulty dressing/grooming: Denies Difficulty climbing stairs: Denies Difficulty getting out of chair: Denies Difficulty using hands for taps, buttons,  cutlery, and/or writing: Denies  Review of Systems  Constitutional:  Positive for fatigue.  HENT:  Positive for mouth dryness. Negative for mouth sores.   Eyes:  Negative for dryness.  Respiratory:  Negative for shortness of breath.   Cardiovascular:  Negative for chest pain and palpitations.  Gastrointestinal:  Negative for blood in stool, constipation and diarrhea.  Endocrine: Negative for increased urination.  Genitourinary:  Negative for involuntary urination.  Musculoskeletal:  Positive for morning stiffness. Negative for joint pain, gait problem, joint pain, joint swelling, myalgias, muscle weakness, muscle tenderness and myalgias.  Skin:  Positive for sensitivity to sunlight. Negative for color change, rash and hair loss.  Allergic/Immunologic: Negative for susceptible to infections.  Neurological:  Negative for dizziness and headaches.  Hematological:  Negative for swollen glands.  Psychiatric/Behavioral:  Negative for depressed mood and sleep disturbance. The patient is not nervous/anxious.     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   Esophageal reflux 05/16/2009    Past Medical History:  Diagnosis Date   Abnormal Pap smear    yrs ago   Allergy    Aortic aneurysm (HCC)    GERD (gastroesophageal reflux disease)    Thyroid nodule     Family  History  Problem Relation Age of Onset   Hypertension Mother    Hyperlipidemia Mother    Stroke Mother    Cancer Mother        bile duct cancer   Colon polyps Mother    Breast cancer Mother 15   Heart attack Mother    Heart attack Father    Heart failure Father    Heart disease Father    Other Sister        hx of brain surgery due to fluid   Stroke Maternal Grandmother    Hypertension Maternal  Grandmother    Heart disease Paternal Grandmother    Colon cancer Neg Hx    Esophageal cancer Neg Hx    Rectal cancer Neg Hx    Stomach cancer Neg Hx    Past Surgical History:  Procedure Laterality Date   BREAST REDUCTION SURGERY  2006   COLPOSCOPY     INTRAUTERINE DEVICE INSERTION     mirena insertion 2013, 10-05-17 removed & re-inserted   REDUCTION MAMMAPLASTY Bilateral 2006   WISDOM TOOTH EXTRACTION     x2   Social History   Social History Narrative   Not on file   Immunization History  Administered Date(s) Administered   Influenza,inj,Quad PF,6+ Mos 11/02/2022   Moderna Sars-Covid-2 Vaccination 12/06/2019, 01/03/2020   Tdap 08/11/2012, 11/02/2022     Objective: Vital Signs: BP (!) 144/97 (BP Location: Right Arm, Patient Position: Sitting, Cuff Size: Normal)   Pulse (!) 58   Resp 16   Ht 5\' 7"  (1.702 m)   Wt 233 lb 12.8 oz (106.1 kg)   BMI 36.62 kg/m    Physical Exam Vitals and nursing note reviewed.  Constitutional:      Appearance: She is well-developed.  HENT:     Head: Normocephalic and atraumatic.  Eyes:     Conjunctiva/sclera: Conjunctivae normal.  Cardiovascular:     Rate and Rhythm: Normal rate and regular rhythm.     Heart sounds: Normal heart sounds.  Pulmonary:     Effort: Pulmonary effort is normal.     Breath sounds: Normal breath sounds.  Abdominal:     General: Bowel sounds are normal.     Palpations: Abdomen is soft.  Musculoskeletal:     Cervical back: Normal range of motion.  Lymphadenopathy:     Cervical: No cervical adenopathy.  Skin:    General: Skin is warm and dry.     Capillary Refill: Capillary refill takes less than 2 seconds.  Neurological:     Mental Status: She is alert and oriented to person, place, and time.  Psychiatric:        Behavior: Behavior normal.      Musculoskeletal Exam: Cervical, thoracic and lumbar spine were in good range of motion.  Shoulder joints, elbow joints, wrist joints, MCPs PIPs and DIPs with  good range of motion with no synovitis.  Hip joints, knee joints, ankles, MTPs and PIPs were in good range of motion with no synovitis.  Bilateral pes planus was noted.  CDAI Exam: CDAI Score: -- Patient Global: --; Provider Global: -- Swollen: --; Tender: -- Joint Exam 07/27/2023   No joint exam has been documented for this visit   There is currently no information documented on the homunculus. Go to the Rheumatology activity and complete the homunculus joint exam.  Investigation: No additional findings.  Imaging: ECHOCARDIOGRAM COMPLETE  Result Date: 07/26/2023    ECHOCARDIOGRAM REPORT   Patient Name:   [facility] Date  of Exam: 07/26/2023 Medical Rec #:  [id number]   Height:       67.2 in Accession #:    [id number]  Weight:       223.0 lb Date of Birth:  1975/10/10   BSA:          2.123 m Patient Age:    47 years    BP:           124/90 mmHg Patient Gender: F           HR:           62 bpm. Exam Location:  Outpatient Procedure: 2D Echo, 3D Echo, Color Doppler, Cardiac Doppler and Strain Analysis Indications:    Ascending aortic aneurysm  History:        Patient has prior history of Echocardiogram examinations. Risk                 Factors:Non-Smoker.  Sonographer:    Jeryl Columbia RDCS Referring Phys: [id number] BRIDGETTE CHRISTOPHER IMPRESSIONS  1. Left ventricular ejection fraction, by estimation, is 65 to 70%. Left ventricular ejection fraction by 3D volume is 67 %. The left ventricle has normal function. The left ventricle has no regional wall motion abnormalities. Left ventricular diastolic  parameters were normal.  2. Right ventricular systolic function is low normal. The right ventricular size is normal. The estimated right ventricular systolic pressure is 30.9 mmHg.  3. The mitral valve is grossly normal. Trivial mitral valve regurgitation.  4. The aortic valve was not well visualized. Aortic valve regurgitation is not visualized.  5. Aortic dilatation noted. There is mild dilatation of  the ascending aorta, measuring 40 mm.  6. The inferior vena cava is normal in size with greater than 50% respiratory variability, suggesting right atrial pressure of 3 mmHg. Comparison(s): No prior Echocardiogram. CT 01/01/2023:Ascending thoracic aorta aneurysmal up to 4 cm maximum diameter.  FINDINGS  Left Ventricle: Left ventricular ejection fraction, by estimation, is 65 to 70%. Left ventricular ejection fraction by 3D volume is 67 %. The left ventricle has normal function. The left ventricle has no regional wall motion abnormalities. The left ventricular internal cavity size was normal in size. There is no left ventricular hypertrophy. Left ventricular diastolic parameters were normal. Right Ventricle: The right ventricular size is normal. No increase in right ventricular wall thickness. Right ventricular systolic function is low normal. The tricuspid regurgitant velocity is 2.64 m/s, and with an assumed right atrial pressure of 3 mmHg, the estimated right ventricular systolic pressure is 30.9 mmHg. Left Atrium: Left atrial size was normal in size. Right Atrium: Right atrial size was normal in size. Pericardium: There is no evidence of pericardial effusion. Mitral Valve: The mitral valve is grossly normal. Trivial mitral valve regurgitation. Tricuspid Valve: The tricuspid valve is grossly normal. Tricuspid valve regurgitation is trivial. Aortic Valve: The aortic valve was not well visualized. Aortic valve regurgitation is not visualized. Pulmonic Valve: The pulmonic valve was not well visualized. Pulmonic valve regurgitation is not visualized. Aorta: Aortic dilatation noted. There is mild dilatation of the ascending aorta, measuring 40 mm. Venous: The inferior vena cava is normal in size with greater than 50% respiratory variability, suggesting right atrial pressure of 3 mmHg. IAS/Shunts: No atrial level shunt detected by color flow Doppler.  LEFT VENTRICLE PLAX 2D LVIDd:         4.52 cm         Diastology LVIDs:          3.00 cm  LV e' medial:    6.96 cm/s LV PW:         0.99 cm         LV E/e' medial:  13.7 LV IVS:        0.80 cm         LV e' lateral:   9.14 cm/s LVOT diam:     2.00 cm         LV E/e' lateral: 10.4 LV SV:         70 LV SV Index:   33 LVOT Area:     3.14 cm        3D Volume EF                                LV 3D EF:    Left                                             ventricul                                             ar                                             ejection                                             fraction                                             by 3D                                             volume is                                             67 %.                                 3D Volume EF:                                3D EF:        67 %                                LV EDV:       127 ml  LV ESV:       42 ml                                LV SV:        85 ml RIGHT VENTRICLE RV Basal diam:  4.74 cm RV Mid diam:    4.38 cm RV S prime:     5.87 cm/s TAPSE (M-mode): 1.1 cm LEFT ATRIUM             Index        RIGHT ATRIUM           Index LA diam:        2.80 cm 1.32 cm/m   RA Area:     16.30 cm LA Vol (A2C):   79.7 ml 37.53 ml/m  RA Volume:   41.20 ml  19.40 ml/m LA Vol (A4C):   40.2 ml 18.93 ml/m LA Biplane Vol: 56.7 ml 26.70 ml/m  AORTIC VALVE LVOT Vmax:   93.80 cm/s LVOT Vmean:  60.300 cm/s LVOT VTI:    0.224 m  AORTA Ao Root diam: 3.60 cm Ao Asc diam:  3.90 cm MITRAL VALVE               TRICUSPID VALVE MV Area (PHT): 4.39 cm    TR Peak grad:   27.9 mmHg MV Decel Time: 173 msec    TR Vmax:        264.00 cm/s MV E velocity: 95.40 cm/s MV A velocity: 62.90 cm/s  SHUNTS MV E/A ratio:  1.52        Systemic VTI:  0.22 m                            Systemic Diam: 2.00 cm Zoila Shutter MD Electronically signed by Zoila Shutter MD Signature Date/Time: 07/26/2023/6:15:52 PM    Final     Recent Labs: Lab Results  Component Value Date    WBC 6.9 01/19/2023   HGB 12.9 01/19/2023   PLT 186 01/19/2023   NA 140 01/19/2023   K 4.6 01/19/2023   CL 105 01/19/2023   CO2 24 01/19/2023   GLUCOSE 75 01/19/2023   BUN 9 01/19/2023   CREATININE 0.74 01/19/2023   BILITOT 0.5 01/19/2023   ALKPHOS 112 08/19/2020   AST 14 01/19/2023   ALT 10 01/19/2023   PROT 6.9 01/19/2023   ALBUMIN 4.0 08/19/2020   CALCIUM 9.4 01/19/2023   GFRAA 108 08/19/2020    Speciality Comments: No specialty comments available.  Procedures:  No procedures performed Allergies: Latex, Monistat [miconazole], and Prilosec [omeprazole]   Assessment / Plan:     Visit Diagnoses: Positive ANA (antinuclear antibody) - 01/26/23: ANA 1:320nuclear, dense fine speckled, anti-MPO-, anti-PR3-, C-ANCA-, P-ANCA-, actin ab-, mitochondrial ab-, A-1 antitrypsin- -patient was found to have positive ANA during her GI workup.  For that reason she was referred to me.  She denies any history of oral ulcers, nasal ulcers, malar rash, full sensitivity, Raynaud's, lymphadenopathy or inflammatory arthritis.  There is no family history of autoimmune disease.  I will obtain additional labs today.  Future increased risk of autoimmune disease with positive ANA was discussed.  Association of thyroid disease with positive ANA was also discussed.  I advised patient to contact me if she develops any new symptoms. - Plan: Protein / creatinine ratio, urine, Sedimentation rate, ANA, Anti-scleroderma antibody, Sjogrens syndrome-A extractable  nuclear antibody, Anti-Smith antibody, RNP Antibody, Sjogrens syndrome-B extractable nuclear antibody, Anti-DNA antibody, double-stranded, C3 and C4, Thyroglobulin antibody, Thyroid peroxidase antibody  Pes planus of both feet-she had bilateral pes planus on the examination today.  She is not having any discomfort.  Use of arch support was advised.  Aneurysm of ascending aorta without rupture (HCC)-incidental finding on the ultrasound abdomen.  History of  esophageal reflux-Followed by gastroenterology.  Elevated hemidiaphragm-incidental finding on the ultrasound abdomen.  Thyroid nodule-patient has been evaluated by Dr. Gerrit Friends.  Orders: Orders Placed This Encounter  Procedures   Protein / creatinine ratio, urine   Sedimentation rate   ANA   Anti-scleroderma antibody   Sjogrens syndrome-A extractable nuclear antibody   Anti-Smith antibody   RNP Antibody   Sjogrens syndrome-B extractable nuclear antibody   Anti-DNA antibody, double-stranded   C3 and C4   Thyroglobulin antibody   Thyroid peroxidase antibody   No orders of the defined types were placed in this encounter.    Follow-Up Instructions: Return for Positive ANA.   Pollyann Savoy, MD  Note - This record has been created using Animal nutritionist.  Chart creation errors have been sought, but may not always  have been located. Such creation errors do not reflect on  the standard of medical care.

## 2023-07-26 ENCOUNTER — Ambulatory Visit (HOSPITAL_BASED_OUTPATIENT_CLINIC_OR_DEPARTMENT_OTHER): Payer: 59

## 2023-07-26 DIAGNOSIS — I7121 Aneurysm of the ascending aorta, without rupture: Secondary | ICD-10-CM

## 2023-07-26 LAB — ECHOCARDIOGRAM COMPLETE
Area-P 1/2: 4.39 cm2
S' Lateral: 3 cm

## 2023-07-27 ENCOUNTER — Encounter: Payer: Self-pay | Admitting: Rheumatology

## 2023-07-27 ENCOUNTER — Ambulatory Visit: Payer: 59 | Attending: Rheumatology | Admitting: Rheumatology

## 2023-07-27 VITALS — BP 144/96 | HR 57 | Resp 16 | Ht 67.0 in | Wt 233.8 lb

## 2023-07-27 DIAGNOSIS — E041 Nontoxic single thyroid nodule: Secondary | ICD-10-CM

## 2023-07-27 DIAGNOSIS — I7121 Aneurysm of the ascending aorta, without rupture: Secondary | ICD-10-CM

## 2023-07-27 DIAGNOSIS — M2141 Flat foot [pes planus] (acquired), right foot: Secondary | ICD-10-CM | POA: Diagnosis not present

## 2023-07-27 DIAGNOSIS — M2142 Flat foot [pes planus] (acquired), left foot: Secondary | ICD-10-CM

## 2023-07-27 DIAGNOSIS — R768 Other specified abnormal immunological findings in serum: Secondary | ICD-10-CM | POA: Diagnosis not present

## 2023-07-27 DIAGNOSIS — Z8719 Personal history of other diseases of the digestive system: Secondary | ICD-10-CM

## 2023-07-27 DIAGNOSIS — J986 Disorders of diaphragm: Secondary | ICD-10-CM

## 2023-07-30 LAB — SJOGRENS SYNDROME-B EXTRACTABLE NUCLEAR ANTIBODY: SSB (La) (ENA) Antibody, IgG: <1 AI

## 2023-07-30 LAB — RNP ANTIBODY: Ribonucleic Protein(ENA) Antibody, IgG: <1 AI

## 2023-07-30 LAB — PROTEIN / CREATININE RATIO, URINE
Creatinine, Urine: 81 mg/dL (ref 20–275)
Protein/Creat Ratio: 62 mg/g{creat} (ref 24–184)
Protein/Creatinine Ratio: 0.062 mg/mg{creat} (ref 0.024–0.184)
Total Protein, Urine: 5 mg/dL (ref 5–24)

## 2023-07-30 LAB — ANTI-NUCLEAR AB-TITER (ANA TITER): ANA Titer 1: 1:320 {titer} — ABNORMAL HIGH

## 2023-07-30 LAB — ANTI-DNA ANTIBODY, DOUBLE-STRANDED: ds DNA Ab: 1 [IU]/mL

## 2023-07-30 LAB — SEDIMENTATION RATE: Sed Rate: 17 mm/h (ref 0–20)

## 2023-07-30 LAB — ANTI-SMITH ANTIBODY: ENA SM Ab Ser-aCnc: <1 AI

## 2023-07-30 LAB — C3 AND C4
C3 Complement: 160 mg/dL (ref 83–193)
C4 Complement: 38 mg/dL (ref 15–57)

## 2023-07-30 LAB — ANA: Anti Nuclear Antibody (ANA): POSITIVE — AB

## 2023-07-30 LAB — THYROID PEROXIDASE ANTIBODY: Thyroperoxidase Ab SerPl-aCnc: 2 [IU]/mL (ref <9)

## 2023-07-30 LAB — THYROGLOBULIN ANTIBODY: Thyroglobulin Ab: 1 [IU]/mL (ref <=1)

## 2023-07-30 LAB — ANTI-SCLERODERMA ANTIBODY: Scleroderma (Scl-70) (ENA) Antibody, IgG: <1 AI

## 2023-07-30 LAB — SJOGRENS SYNDROME-A EXTRACTABLE NUCLEAR ANTIBODY: SSA (Ro) (ENA) Antibody, IgG: <1 AI

## 2023-08-01 NOTE — Progress Notes (Signed)
ANA is positive . All other labs are within normal limits. I will discuss results at the fu visit.

## 2023-08-03 NOTE — Progress Notes (Deleted)
Office Visit Note  Patient: Christina Hardy             Date of Birth: 17-Oct-1975           MRN: [id number]             PCP: Jeani Sow, MD Referring: Jeani Sow, MD Visit Date: 08/17/2023 Occupation: @GUAROCC @  Subjective:  No chief complaint on file.   History of Present Illness: Christina Hardy is a 47 y.o. female ***     Activities of Daily Living:  Patient reports morning stiffness for *** {minute/hour:19697}.   Patient {ACTIONS;DENIES/REPORTS:21021675::"Denies"} nocturnal pain.  Difficulty dressing/grooming: {ACTIONS;DENIES/REPORTS:21021675::"Denies"} Difficulty climbing stairs: {ACTIONS;DENIES/REPORTS:21021675::"Denies"} Difficulty getting out of chair: {ACTIONS;DENIES/REPORTS:21021675::"Denies"} Difficulty using hands for taps, buttons, cutlery, and/or writing: {ACTIONS;DENIES/REPORTS:21021675::"Denies"}  No Rheumatology ROS completed.   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   Esophageal reflux 05/16/2009    Past Medical History:  Diagnosis Date   Abnormal Pap smear    yrs ago   Allergy    Aortic aneurysm (HCC)    GERD (gastroesophageal reflux disease)    Thyroid nodule     Family History  Problem Relation Age of Onset   Hypertension Mother    Hyperlipidemia Mother    Stroke Mother    Cancer Mother        bile duct cancer   Colon polyps Mother    Breast cancer Mother 59   Heart attack Mother    Heart attack Father    Heart failure Father    Heart disease Father    Other Sister        hx of brain surgery due to fluid   Stroke Maternal Grandmother    Hypertension Maternal Grandmother    Heart disease Paternal Grandmother    Colon cancer Neg Hx    Esophageal cancer Neg Hx    Rectal cancer Neg Hx    Stomach cancer Neg Hx     Past Surgical History:  Procedure Laterality Date   BREAST REDUCTION SURGERY  2006   COLPOSCOPY     INTRAUTERINE DEVICE INSERTION     mirena insertion 2013, 10-05-17 removed & re-inserted   REDUCTION MAMMAPLASTY Bilateral 2006   WISDOM TOOTH EXTRACTION     x2   Social History   Social History Narrative   Not on file   Immunization History  Administered Date(s) Administered   Influenza,inj,Quad PF,6+ Mos 11/02/2022   Moderna Sars-Covid-2 Vaccination 12/06/2019, 01/03/2020   Tdap 08/11/2012, 11/02/2022     Objective: Vital Signs: There were no vitals taken for this visit.   Physical Exam   Musculoskeletal Exam: ***  CDAI Exam: CDAI Score: -- Patient Global: --; Provider Global: -- Swollen: --; Tender: -- Joint Exam 08/17/2023   No joint exam has been documented for this visit   There is currently no information documented on the homunculus. Go to the Rheumatology activity and complete the homunculus joint exam.  Investigation: No additional findings.  Imaging: ECHOCARDIOGRAM COMPLETE  Result Date: 07/26/2023    ECHOCARDIOGRAM REPORT   Patient Name:   Christina Hardy Date of Exam: 07/26/2023 Medical Rec #:  [id number]   Height:       67.2 in Accession #:    [id number]  Weight:       223.0 lb Date of  Birth:  09/04/1976   BSA:          2.123 m Patient Age:    47 years    BP:           124/90 mmHg Patient Gender: F           HR:           62 bpm. Exam Location:  Outpatient Procedure: 2D Echo, 3D Echo, Color Doppler, Cardiac Doppler and Strain Analysis Indications:    Ascending aortic aneurysm  History:        Patient has prior history of Echocardiogram examinations. Risk                 Factors:Non-Smoker.  Sonographer:    Jeryl Columbia RDCS Referring Phys: [id number] BRIDGETTE CHRISTOPHER IMPRESSIONS  1. Left ventricular ejection fraction, by estimation, is 65 to 70%. Left ventricular ejection fraction by 3D volume is 67 %. The left ventricle has normal function. The left  ventricle has no regional wall motion abnormalities. Left ventricular diastolic  parameters were normal.  2. Right ventricular systolic function is low normal. The right ventricular size is normal. The estimated right ventricular systolic pressure is 30.9 mmHg.  3. The mitral valve is grossly normal. Trivial mitral valve regurgitation.  4. The aortic valve was not well visualized. Aortic valve regurgitation is not visualized.  5. Aortic dilatation noted. There is mild dilatation of the ascending aorta, measuring 40 mm.  6. The inferior vena cava is normal in size with greater than 50% respiratory variability, suggesting right atrial pressure of 3 mmHg. Comparison(s): No prior Echocardiogram. CT 01/01/2023:Ascending thoracic aorta aneurysmal up to 4 cm maximum diameter.  FINDINGS  Left Ventricle: Left ventricular ejection fraction, by estimation, is 65 to 70%. Left ventricular ejection fraction by 3D volume is 67 %. The left ventricle has normal function. The left ventricle has no regional wall motion abnormalities. The left ventricular internal cavity size was normal in size. There is no left ventricular hypertrophy. Left ventricular diastolic parameters were normal. Right Ventricle: The right ventricular size is normal. No increase in right ventricular wall thickness. Right ventricular systolic function is low normal. The tricuspid regurgitant velocity is 2.64 m/s, and with an assumed right atrial pressure of 3 mmHg, the estimated right ventricular systolic pressure is 30.9 mmHg. Left Atrium: Left atrial size was normal in size. Right Atrium: Right atrial size was normal in size. Pericardium: There is no evidence of pericardial effusion. Mitral Valve: The mitral valve is grossly normal. Trivial mitral valve regurgitation. Tricuspid Valve: The tricuspid valve is grossly normal. Tricuspid valve regurgitation is trivial. Aortic Valve: The aortic valve was not well visualized. Aortic valve regurgitation is not  visualized. Pulmonic Valve: The pulmonic valve was not well visualized. Pulmonic valve regurgitation is not visualized. Aorta: Aortic dilatation noted. There is mild dilatation of the ascending aorta, measuring 40 mm. Venous: The inferior vena cava is normal in size with greater than 50% respiratory variability, suggesting right atrial pressure of 3 mmHg. IAS/Shunts: No atrial level shunt detected by color flow Doppler.  LEFT VENTRICLE PLAX 2D LVIDd:         4.52 cm         Diastology LVIDs:         3.00 cm         LV e' medial:    6.96 cm/s LV PW:         0.99 cm         LV E/e'  medial:  13.7 LV IVS:        0.80 cm         LV e' lateral:   9.14 cm/s LVOT diam:     2.00 cm         LV E/e' lateral: 10.4 LV SV:         70 LV SV Index:   33 LVOT Area:     3.14 cm        3D Volume EF                                LV 3D EF:    Left                                             ventricul                                             ar                                             ejection                                             fraction                                             by 3D                                             volume is                                             67 %.                                 3D Volume EF:                                3D EF:        67 %                                LV EDV:       127 ml                                LV ESV:       42 ml  LV SV:        85 ml RIGHT VENTRICLE RV Basal diam:  4.74 cm RV Mid diam:    4.38 cm RV S prime:     5.87 cm/s TAPSE (M-mode): 1.1 cm LEFT ATRIUM             Index        RIGHT ATRIUM           Index LA diam:        2.80 cm 1.32 cm/m   RA Area:     16.30 cm LA Vol (A2C):   79.7 ml 37.53 ml/m  RA Volume:   41.20 ml  19.40 ml/m LA Vol (A4C):   40.2 ml 18.93 ml/m LA Biplane Vol: 56.7 ml 26.70 ml/m  AORTIC VALVE LVOT Vmax:   93.80 cm/s LVOT Vmean:  60.300 cm/s LVOT VTI:    0.224 m  AORTA Ao Root diam: 3.60 cm  Ao Asc diam:  3.90 cm MITRAL VALVE               TRICUSPID VALVE MV Area (PHT): 4.39 cm    TR Peak grad:   27.9 mmHg MV Decel Time: 173 msec    TR Vmax:        264.00 cm/s MV E velocity: 95.40 cm/s MV A velocity: 62.90 cm/s  SHUNTS MV E/A ratio:  1.52        Systemic VTI:  0.22 m                            Systemic Diam: 2.00 cm Zoila Shutter MD Electronically signed by Zoila Shutter MD Signature Date/Time: 07/26/2023/6:15:52 PM    Final     Recent Labs: Lab Results  Component Value Date   WBC 6.9 01/19/2023   HGB 12.9 01/19/2023   PLT 186 01/19/2023   NA 140 01/19/2023   K 4.6 01/19/2023   CL 105 01/19/2023   CO2 24 01/19/2023   GLUCOSE 75 01/19/2023   BUN 9 01/19/2023   CREATININE 0.74 01/19/2023   BILITOT 0.5 01/19/2023   ALKPHOS 112 08/19/2020   AST 14 01/19/2023   ALT 10 01/19/2023   PROT 6.9 01/19/2023   ALBUMIN 4.0 08/19/2020   CALCIUM 9.4 01/19/2023   GFRAA 108 08/19/2020   July 27, 2023 urine protein creatinine ratio normal, ANA 1: 320 NS, ENA (SCL 70, SSA, SSB, RNP, Smith, dsDNA) negative, C3-C4 normal, thyroglobulin antibody negative, TPO antibody negative, ESR 17  Speciality Comments: No specialty comments available.  Procedures:  No procedures performed Allergies: Latex, Monistat [miconazole], and Prilosec [omeprazole]   Assessment / Plan:     Visit Diagnoses: No diagnosis found.  Orders: No orders of the defined types were placed in this encounter.  No orders of the defined types were placed in this encounter.   Face-to-face time spent with patient was *** minutes. Greater than 50% of time was spent in counseling and coordination of care.  Follow-Up Instructions: No follow-ups on file.   Pollyann Savoy, MD  Note - This record has been created using Animal nutritionist.  Chart creation errors have been sought, but may not always  have been located. Such creation errors do not reflect on  the standard of medical care.

## 2023-08-17 ENCOUNTER — Ambulatory Visit: Payer: 59 | Admitting: Rheumatology

## 2023-08-17 ENCOUNTER — Telehealth: Payer: Self-pay | Admitting: Rheumatology

## 2023-08-17 DIAGNOSIS — I7121 Aneurysm of the ascending aorta, without rupture: Secondary | ICD-10-CM

## 2023-08-17 DIAGNOSIS — M2141 Flat foot [pes planus] (acquired), right foot: Secondary | ICD-10-CM

## 2023-08-17 DIAGNOSIS — E041 Nontoxic single thyroid nodule: Secondary | ICD-10-CM

## 2023-08-17 DIAGNOSIS — J986 Disorders of diaphragm: Secondary | ICD-10-CM

## 2023-08-17 DIAGNOSIS — Z8719 Personal history of other diseases of the digestive system: Secondary | ICD-10-CM

## 2023-08-17 DIAGNOSIS — R768 Other specified abnormal immunological findings in serum: Secondary | ICD-10-CM

## 2023-08-17 NOTE — Telephone Encounter (Signed)
Called patient to reschedule npt fu appointment.  Patient states she left a mychart message requesting the appointment be virtual since she drove over 2 hours to find out her appointment had been cancelled.   Okay to reschedule to virtual appointment? Please advise

## 2023-08-17 NOTE — Telephone Encounter (Signed)
Okay to schedule a virtual visit.

## 2023-08-18 NOTE — Progress Notes (Signed)
Virtual Visit via Video Note  I connected with Christina Hardy on 08/25/23 at 12:30 PM EST by a video enabled telemedicine application and verified that I am speaking with the correct person using two identifiers.     Location: Patient: Home Provider: Office   I discussed the limitations of evaluation and management by telemedicine and the availability of in person appointments. The patient expressed understanding and agreed to proceed.   Subjective: Positive ANA History of Present Illness:Christina Hardy is a 47 y.o. female who was initially evaluated on July 27, 2023 per request of Dr. Lavon Paganini for positive ANA.  Positive ANA was incidental finding during the GI workup.  Patient denies any history of fatigue, oral ulcers, nasal ulcers, malar rash, photosensitivity, Raynaud's, lymphadenopathy or inflammatory arthritis.  Activities of daily living: Patient reports morning stiffness for  2 minutes   Patient denies nocturnal pain.  Difficulty dressing/grooming: Denies Difficulty climbing stairs: Denies Difficulty getting out of chair: Denies Difficulty using hands for taps, buttons, cutlery, and/or writing: Denies  Review of Systems  Constitutional:  Positive for malaise/fatigue.  HENT:  Negative for congestion.   Eyes:  Negative for pain.  Respiratory:  Negative for shortness of breath.   Cardiovascular:  Negative for chest pain and palpitations.  Gastrointestinal:  Negative for blood in stool, constipation and diarrhea.  Genitourinary:  Negative for frequency.  Musculoskeletal:  Negative for joint pain.  Neurological:  Negative for dizziness, weakness and headaches.  Endo/Heme/Allergies:  Does not bruise/bleed easily.  Psychiatric/Behavioral:  Negative for depression. The patient is not nervous/anxious and does not have insomnia.       Observations/Objective: Physical Exam Neurological:     Mental Status: She is alert.  Psychiatric:        Mood and Affect: Mood normal.         Behavior: Behavior normal.        Thought Content: Thought content normal.      July 27, 2023 urine protein creatinine ratio normal, ANA 1: 320 NS, ENA (SCL 70, SSA, SSB, RNP, Smith, dsDNA) negative, C3-C4 normal, ESR 17 thyroglobulin negative, TPO negative  01/26/23: ANA 1:320nuclear, dense fine speckled, anti-MPO-, anti-PR3-, C-ANCA-, P-ANCA-, actin ab-, mitochondrial ab-, A-1 antitrypsin-   Assessment and Plan: Visit Diagnoses: Positive ANA (antinuclear antibody) -patient had ANA titer positive x 2.  ENA panel negative, complements normal, sed rate normal.  She denies any history of oral ulcers, nasal ulcers, malar rash, full sensitivity, Raynaud's, lymphadenopathy or inflammatory arthritis.  Antithyroid antibodies were negative as well.  I advised patient to contact me if she develops any new symptoms.    Pes planus of both feet-use of arch support was discussed.   Aneurysm of ascending aorta without rupture (HCC)-incidental finding on the ultrasound abdomen.   History of esophageal reflux-Followed by gastroenterology.   Elevated hemidiaphragm-incidental finding on the ultrasound abdomen.   Thyroid nodule-patient has been evaluated by Dr. Gerrit Friends.  Follow Up Instructions:    I discussed the assessment and treatment plan with the patient. The patient was provided an opportunity to ask questions and all were answered. The patient agreed with the plan and demonstrated an understanding of the instructions.   The patient was advised to call back or seek an in-person evaluation if the symptoms worsen or if the condition fails to improve as anticipated.  I provided 15 minutes of non-face-to-face time during this encounter.   Pollyann Savoy, MD

## 2023-08-25 ENCOUNTER — Ambulatory Visit: Payer: 59 | Attending: Rheumatology | Admitting: Rheumatology

## 2023-08-25 ENCOUNTER — Encounter: Payer: Self-pay | Admitting: Rheumatology

## 2023-08-25 VITALS — Ht 67.0 in

## 2023-08-25 DIAGNOSIS — M2141 Flat foot [pes planus] (acquired), right foot: Secondary | ICD-10-CM

## 2023-08-25 DIAGNOSIS — M2142 Flat foot [pes planus] (acquired), left foot: Secondary | ICD-10-CM | POA: Diagnosis not present

## 2023-08-25 DIAGNOSIS — R768 Other specified abnormal immunological findings in serum: Secondary | ICD-10-CM

## 2023-08-25 DIAGNOSIS — Z8719 Personal history of other diseases of the digestive system: Secondary | ICD-10-CM

## 2023-08-25 DIAGNOSIS — J986 Disorders of diaphragm: Secondary | ICD-10-CM

## 2023-08-25 DIAGNOSIS — I7121 Aneurysm of the ascending aorta, without rupture: Secondary | ICD-10-CM

## 2023-08-25 DIAGNOSIS — E041 Nontoxic single thyroid nodule: Secondary | ICD-10-CM
